# Patient Record
Sex: Male | Born: 1935 | Race: White | Smoking: Never smoker
Health system: Southern US, Community
[De-identification: ages and names within clinical notes are randomized; demographics above are authoritative.]

## PROBLEM LIST (undated history)

## (undated) DIAGNOSIS — H3530 Unspecified macular degeneration: Secondary | ICD-10-CM

## (undated) DIAGNOSIS — N400 Enlarged prostate without lower urinary tract symptoms: Secondary | ICD-10-CM

## (undated) DIAGNOSIS — M1990 Unspecified osteoarthritis, unspecified site: Secondary | ICD-10-CM

## (undated) DIAGNOSIS — H409 Unspecified glaucoma: Secondary | ICD-10-CM

## (undated) DIAGNOSIS — N289 Disorder of kidney and ureter, unspecified: Secondary | ICD-10-CM

## (undated) DIAGNOSIS — I442 Atrioventricular block, complete: Secondary | ICD-10-CM

## (undated) DIAGNOSIS — Z950 Presence of cardiac pacemaker: Secondary | ICD-10-CM

## (undated) DIAGNOSIS — R296 Repeated falls: Secondary | ICD-10-CM

## (undated) DIAGNOSIS — C801 Malignant (primary) neoplasm, unspecified: Secondary | ICD-10-CM

## (undated) DIAGNOSIS — K274 Chronic or unspecified peptic ulcer, site unspecified, with hemorrhage: Secondary | ICD-10-CM

## (undated) DIAGNOSIS — I10 Essential (primary) hypertension: Secondary | ICD-10-CM

## (undated) DIAGNOSIS — I4891 Unspecified atrial fibrillation: Secondary | ICD-10-CM

## (undated) DIAGNOSIS — E559 Vitamin D deficiency, unspecified: Secondary | ICD-10-CM

## (undated) HISTORY — PX: EXTERNAL EAR SURGERY: SHX627

## (undated) HISTORY — PX: APPENDECTOMY: SHX54

## (undated) HISTORY — PX: BRAIN MENINGIOMA EXCISION: SHX576

## (undated) HISTORY — PX: EYE SURGERY: SHX253

## (undated) HISTORY — PX: PACEMAKER INSERTION: SHX728

## (undated) HISTORY — PX: TONSILLECTOMY: SUR1361

---

## 1994-07-23 DIAGNOSIS — C801 Malignant (primary) neoplasm, unspecified: Secondary | ICD-10-CM

## 1994-07-23 HISTORY — DX: Malignant (primary) neoplasm, unspecified: C80.1

## 2011-01-09 ENCOUNTER — Other Ambulatory Visit: Payer: Self-pay | Admitting: Family Medicine

## 2011-01-10 ENCOUNTER — Other Ambulatory Visit: Payer: Self-pay | Admitting: Family Medicine

## 2011-01-10 DIAGNOSIS — Z9889 Other specified postprocedural states: Secondary | ICD-10-CM

## 2011-01-12 ENCOUNTER — Ambulatory Visit
Admission: RE | Admit: 2011-01-12 | Discharge: 2011-01-12 | Disposition: A | Discharge status: Home / Self Care | Payer: Self-pay | Source: Ambulatory Visit | Attending: Family Medicine | Admitting: Family Medicine

## 2011-01-12 DIAGNOSIS — Z8603 Personal history of neoplasm of uncertain behavior: Secondary | ICD-10-CM

## 2011-01-12 MED ORDER — IOHEXOL 300 MG/ML  SOLN
50.0000 mL | Freq: Once | INTRAMUSCULAR | Status: AC | PRN
  Administered 2011-01-12: 50 mL via INTRAVENOUS

## 2011-05-24 DEATH — deceased

## 2014-11-10 ENCOUNTER — Emergency Department (HOSPITAL_BASED_OUTPATIENT_CLINIC_OR_DEPARTMENT_OTHER)
Admission: EM | Admit: 2014-11-10 | Discharge: 2014-11-10 | Disposition: A | Discharge status: Home / Self Care | Payer: Medicare HMO | Attending: Emergency Medicine | Admitting: Emergency Medicine

## 2014-11-10 ENCOUNTER — Emergency Department (HOSPITAL_BASED_OUTPATIENT_CLINIC_OR_DEPARTMENT_OTHER): Payer: Medicare HMO

## 2014-11-10 ENCOUNTER — Encounter (HOSPITAL_BASED_OUTPATIENT_CLINIC_OR_DEPARTMENT_OTHER): Payer: Self-pay

## 2014-11-10 DIAGNOSIS — Z8639 Personal history of other endocrine, nutritional and metabolic disease: Secondary | ICD-10-CM | POA: Diagnosis not present

## 2014-11-10 DIAGNOSIS — Z8711 Personal history of peptic ulcer disease: Secondary | ICD-10-CM | POA: Insufficient documentation

## 2014-11-10 DIAGNOSIS — Z87438 Personal history of other diseases of male genital organs: Secondary | ICD-10-CM | POA: Insufficient documentation

## 2014-11-10 DIAGNOSIS — M1990 Unspecified osteoarthritis, unspecified site: Secondary | ICD-10-CM | POA: Diagnosis not present

## 2014-11-10 DIAGNOSIS — I10 Essential (primary) hypertension: Secondary | ICD-10-CM | POA: Diagnosis not present

## 2014-11-10 DIAGNOSIS — W01198A Fall on same level from slipping, tripping and stumbling with subsequent striking against other object, initial encounter: Secondary | ICD-10-CM | POA: Insufficient documentation

## 2014-11-10 DIAGNOSIS — H409 Unspecified glaucoma: Secondary | ICD-10-CM | POA: Insufficient documentation

## 2014-11-10 DIAGNOSIS — Y998 Other external cause status: Secondary | ICD-10-CM | POA: Insufficient documentation

## 2014-11-10 DIAGNOSIS — S0991XA Unspecified injury of ear, initial encounter: Secondary | ICD-10-CM | POA: Diagnosis present

## 2014-11-10 DIAGNOSIS — Y9289 Other specified places as the place of occurrence of the external cause: Secondary | ICD-10-CM | POA: Insufficient documentation

## 2014-11-10 DIAGNOSIS — Z950 Presence of cardiac pacemaker: Secondary | ICD-10-CM | POA: Insufficient documentation

## 2014-11-10 DIAGNOSIS — Z7982 Long term (current) use of aspirin: Secondary | ICD-10-CM | POA: Diagnosis not present

## 2014-11-10 DIAGNOSIS — Z79899 Other long term (current) drug therapy: Secondary | ICD-10-CM | POA: Insufficient documentation

## 2014-11-10 DIAGNOSIS — Y9389 Activity, other specified: Secondary | ICD-10-CM | POA: Diagnosis not present

## 2014-11-10 DIAGNOSIS — S00431A Contusion of right ear, initial encounter: Secondary | ICD-10-CM | POA: Insufficient documentation

## 2014-11-10 DIAGNOSIS — Z87448 Personal history of other diseases of urinary system: Secondary | ICD-10-CM | POA: Insufficient documentation

## 2014-11-10 HISTORY — DX: Unspecified glaucoma: H40.9

## 2014-11-10 HISTORY — DX: Presence of cardiac pacemaker: Z95.0

## 2014-11-10 HISTORY — DX: Unspecified atrial fibrillation: I48.91

## 2014-11-10 HISTORY — DX: Unspecified osteoarthritis, unspecified site: M19.90

## 2014-11-10 HISTORY — DX: Benign prostatic hyperplasia without lower urinary tract symptoms: N40.0

## 2014-11-10 HISTORY — DX: Disorder of kidney and ureter, unspecified: N28.9

## 2014-11-10 HISTORY — DX: Chronic or unspecified peptic ulcer, site unspecified, with hemorrhage: K27.4

## 2014-11-10 HISTORY — DX: Vitamin D deficiency, unspecified: E55.9

## 2014-11-10 HISTORY — DX: Essential (primary) hypertension: I10

## 2014-11-10 HISTORY — DX: Unspecified macular degeneration: H35.30

## 2014-11-10 HISTORY — DX: Atrioventricular block, complete: I44.2

## 2014-11-10 NOTE — ED Notes (Signed)
Urinal provided.

## 2014-11-10 NOTE — ED Notes (Signed)
Patient transported to CT 

## 2014-11-10 NOTE — ED Notes (Signed)
MD at bedside. 

## 2014-11-10 NOTE — ED Provider Notes (Signed)
CSN: [id number]     Arrival date & time 11/10/14  1504 History   First MD Initiated Contact with Patient 11/10/14 1518     Chief Complaint  Patient presents with  . Fall     Patient is a 81 y.o. male presenting with fall. The history is provided by the patient and a relative. No language interpreter was used.  Fall   Gerald Fox presents for evaluation of injuries on the fall. He had a fall around 7 AM when he was trying to get into the tub and the rail broke away. He fell back, striking the right ear on the ground. He denies any loss of consciousness. He denies any headache, nausea, vomiting, weakness, chest pain. Symptoms are moderate, constant, worsening.  Past Medical History  Diagnosis Date  . Hypertension   . Third degree AV block   . Pacemaker   . Renal disorder   . Renal insufficiency   . BPH (benign prostatic hyperplasia)   . Glaucoma   . Macular degeneration   . A-fib   . Peptic ulcer disease with hemorrhage   . Vitamin D deficiency   . Arthritis    Past Surgical History  Procedure Laterality Date  . Brain meningioma excision    . Pacemaker insertion    . Appendectomy    . Tonsillectomy    . Eye surgery     No family history on file. History  Substance Use Topics  . Smoking status: Never Smoker   . Smokeless tobacco: Not on file  . Alcohol Use: Yes    Review of Systems  All other systems reviewed and are negative.     Allergies  Review of patient's allergies indicates no known allergies.  Home Medications   Prior to Admission medications   Medication Sig Start Date End Date Taking? Authorizing Provider  amLODipine (NORVASC) 5 MG tablet Take 5 mg by mouth daily.   Yes Historical Provider, MD  aspirin EC 81 MG tablet Take 81 mg by mouth daily.   Yes Historical Provider, MD  dorzolamide (TRUSOPT) 2 % ophthalmic solution 1 drop 3 (three) times daily.   Yes Historical Provider, MD  hydrochlorothiazide (MICROZIDE) 12.5 MG capsule Take 12.5 mg by mouth  daily.   Yes Historical Provider, MD  latanoprost (XALATAN) 0.005 % ophthalmic solution 1 drop at bedtime.   Yes Historical Provider, MD  metoprolol succinate (TOPROL-XL) 100 MG 24 hr tablet Take 100 mg by mouth daily. Take with or immediately following a meal.   Yes Historical Provider, MD   BP 170/67 mmHg  Pulse 54  Temp(Src) 97.9 F (36.6 C) (Oral)  Resp 16  Ht 5\' 5"  (1.651 m)  Wt 164 lb (74.39 kg)  BMI 27.29 kg/m2  SpO2 100% Physical Exam  Constitutional: He is oriented to person, place, and time. He appears well-developed and well-nourished.  HENT:  Head: Normocephalic.  Large amounts of swelling and ecchymosis to the helix and antihelix of the ear. There is a small, less than 1 cm laceration to the posterior ear.  Eyes:  Left pupil 2-3 mm and reactive to light, right pupil 3-4 mm and reactive to light.  Cardiovascular: Normal rate and regular rhythm.   No murmur heard. Pulmonary/Chest: Effort normal and breath sounds normal. No respiratory distress. He exhibits no tenderness.  Abdominal: Soft. There is no tenderness. There is no rebound and no guarding.  Musculoskeletal: He exhibits no tenderness.  1+ pitting edema in BLE. No extremity tenderness to palpation. No  C, T, L-spine tenderness to palpation.  Neurological: He is alert and oriented to person, place, and time.  Skin: Skin is warm and dry.  Psychiatric: He has a normal mood and affect. His behavior is normal.  Nursing note and vitals reviewed.   ED Course  Procedures (including critical care time) Labs Review Labs Reviewed - No data to display  Imaging Review Ct Head Wo Contrast  11/10/2014   CLINICAL DATA:  81 year old male who fell and hit head on toilet. Bleeding from a here. Initial encounter.  EXAM: CT HEAD WITHOUT CONTRAST  CT CERVICAL SPINE WITHOUT CONTRAST  TECHNIQUE: Multidetector CT imaging of the head and cervical spine was performed following the standard protocol without intravenous contrast.  Multiplanar CT image reconstructions of the cervical spine were also generated.  COMPARISON:  Head CT 01/12/2011.  FINDINGS: CT HEAD FINDINGS  Similar mild paranasal sinus mucosal thickening. Bilateral tympanic cavities are clear. Chronic mild inferior left mastoid effusion is stable. Otherwise bilateral mastoids are clear. Bilateral hearing aids.  On the right there is severe soft tissue thickening of the pinna up to 16 mm in thickness. No adjacent scalp soft tissue hematoma identified.  No acute scalp soft tissue injury identified elsewhere. Sequelae of left vertex craniotomy appears stable. Superimposed right vertex burr hole placement. No acute osseous abnormality identified.  Chronic left superior frontal gyrus encephalomalacia. No ventriculomegaly. No midline shift, mass effect, or evidence of intracranial mass lesion. No acute intracranial hemorrhage identified. No evidence of cortically based acute infarction identified. No suspicious intracranial vascular hyperdensity.  CT CERVICAL SPINE FINDINGS  C2-C3 ankylosis. Visualized skull base is intact. No atlanto-occipital dissociation. Odontoid intact. Exaggerated cervical lordosis. Cervicothoracic junction alignment is within normal limits. Bilateral posterior element alignment is within normal limits. Widespread cervical chronic disc, endplate, and facet degeneration. No acute cervical spine fracture identified. Negative lung apices. Negative noncontrast paraspinal soft tissues.  IMPRESSION: 1. Severe soft tissue swelling / hematoma of the right pinna. No underlying fracture identified. 2. No acute traumatic injury to the brain identified. 3. No acute fracture or listhesis identified in the cervical spine. Ligamentous injury is not excluded.   Electronically Signed   By: Genevie Ann M.D.   On: 11/10/2014 16:21   Ct Cervical Spine Wo Contrast  11/10/2014   CLINICAL DATA:  81 year old male who fell and hit head on toilet. Bleeding from a here. Initial encounter.   EXAM: CT HEAD WITHOUT CONTRAST  CT CERVICAL SPINE WITHOUT CONTRAST  TECHNIQUE: Multidetector CT imaging of the head and cervical spine was performed following the standard protocol without intravenous contrast. Multiplanar CT image reconstructions of the cervical spine were also generated.  COMPARISON:  Head CT 01/12/2011.  FINDINGS: CT HEAD FINDINGS  Similar mild paranasal sinus mucosal thickening. Bilateral tympanic cavities are clear. Chronic mild inferior left mastoid effusion is stable. Otherwise bilateral mastoids are clear. Bilateral hearing aids.  On the right there is severe soft tissue thickening of the pinna up to 16 mm in thickness. No adjacent scalp soft tissue hematoma identified.  No acute scalp soft tissue injury identified elsewhere. Sequelae of left vertex craniotomy appears stable. Superimposed right vertex burr hole placement. No acute osseous abnormality identified.  Chronic left superior frontal gyrus encephalomalacia. No ventriculomegaly. No midline shift, mass effect, or evidence of intracranial mass lesion. No acute intracranial hemorrhage identified. No evidence of cortically based acute infarction identified. No suspicious intracranial vascular hyperdensity.  CT CERVICAL SPINE FINDINGS  C2-C3 ankylosis. Visualized skull base is intact. No atlanto-occipital  dissociation. Odontoid intact. Exaggerated cervical lordosis. Cervicothoracic junction alignment is within normal limits. Bilateral posterior element alignment is within normal limits. Widespread cervical chronic disc, endplate, and facet degeneration. No acute cervical spine fracture identified. Negative lung apices. Negative noncontrast paraspinal soft tissues.  IMPRESSION: 1. Severe soft tissue swelling / hematoma of the right pinna. No underlying fracture identified. 2. No acute traumatic injury to the brain identified. 3. No acute fracture or listhesis identified in the cervical spine. Ligamentous injury is not excluded.    Electronically Signed   By: Genevie Ann M.D.   On: 11/10/2014 16:21     EKG Interpretation None      MDM   Final diagnoses:  Ear hematoma, right, initial encounter    Patient here for evaluation of injuries following a mechanical fall. He does have a significant right auricular hematoma. Discussed with Dr. Janace Hoard with ENT, will see in the office tomorrow. Patient has a known history of renal insufficiency and takes baby aspirin daily.  Quintella Reichert, MD 11/10/14 [phone]

## 2014-11-10 NOTE — Discharge Instructions (Signed)
Auricle Injuries You have an injury to your external ear (auricle). The ear has a layer of skin over cartilage. A cut or bruise to the ear can separate the skin from the cartilage underneath. This can cause problems with healing if blood gathers between the skin and the cartilage. Permanent damage to the ear may result if the excess blood is not drained within 1 to 2 days. Stitches, tape, or tissue glue may be used to close a cut. A pressure bandage may be used to keep blood from forming under the injured skin. If there is a lot of blood present (hematoma), a needle aspiration may be needed to remove it. You must have the ear checked within 1 to 2 days or as directed if you have had this type of injury. This is see if the blood has accumulated again. Call your caregiver for a follow-up exam as recommended.  SEEK IMMEDIATE MEDICAL CARE IF:  You develop severe pain.  You develop a fever or pus like drainage.  You have increased hearing loss or other problems. MAKE SURE YOU:   Understand these instructions.  Will watch your condition.  Will get help right away if you are not doing well or get worse. Document Released: 07/09/2005 Document Revised: 10/01/2011 Document Reviewed: 12/26/2006 [facility] Patient Information 2015 [place], [place]. This information is not intended to replace advice given to you by your health care provider. Make sure you discuss any questions you have with your health care provider.

## 2014-11-10 NOTE — ED Notes (Signed)
Pt reports trying to get in tub.  Reports grabbed bar and it fell off and he fell struck right ear on commode.  Was seen at PCP but wanted him to be checked ED.  Pt is on ASA.  Denies LOC

## 2014-12-20 ENCOUNTER — Encounter (HOSPITAL_BASED_OUTPATIENT_CLINIC_OR_DEPARTMENT_OTHER): Payer: Self-pay

## 2014-12-20 ENCOUNTER — Emergency Department (HOSPITAL_BASED_OUTPATIENT_CLINIC_OR_DEPARTMENT_OTHER)
Admission: EM | Admit: 2014-12-20 | Discharge: 2014-12-20 | Disposition: A | Discharge status: Home / Self Care | Payer: Medicare HMO | Attending: Emergency Medicine | Admitting: Emergency Medicine

## 2014-12-20 ENCOUNTER — Emergency Department (HOSPITAL_BASED_OUTPATIENT_CLINIC_OR_DEPARTMENT_OTHER): Payer: Medicare HMO

## 2014-12-20 DIAGNOSIS — Z87448 Personal history of other diseases of urinary system: Secondary | ICD-10-CM | POA: Insufficient documentation

## 2014-12-20 DIAGNOSIS — Z8639 Personal history of other endocrine, nutritional and metabolic disease: Secondary | ICD-10-CM | POA: Diagnosis not present

## 2014-12-20 DIAGNOSIS — Z950 Presence of cardiac pacemaker: Secondary | ICD-10-CM | POA: Diagnosis not present

## 2014-12-20 DIAGNOSIS — I10 Essential (primary) hypertension: Secondary | ICD-10-CM | POA: Diagnosis not present

## 2014-12-20 DIAGNOSIS — Z79899 Other long term (current) drug therapy: Secondary | ICD-10-CM | POA: Diagnosis not present

## 2014-12-20 DIAGNOSIS — M25531 Pain in right wrist: Secondary | ICD-10-CM | POA: Diagnosis present

## 2014-12-20 DIAGNOSIS — M1990 Unspecified osteoarthritis, unspecified site: Secondary | ICD-10-CM | POA: Insufficient documentation

## 2014-12-20 DIAGNOSIS — Z7982 Long term (current) use of aspirin: Secondary | ICD-10-CM | POA: Insufficient documentation

## 2014-12-20 DIAGNOSIS — M109 Gout, unspecified: Secondary | ICD-10-CM | POA: Diagnosis not present

## 2014-12-20 DIAGNOSIS — Z8719 Personal history of other diseases of the digestive system: Secondary | ICD-10-CM | POA: Insufficient documentation

## 2014-12-20 DIAGNOSIS — H409 Unspecified glaucoma: Secondary | ICD-10-CM | POA: Insufficient documentation

## 2014-12-20 LAB — BASIC METABOLIC PANEL
ANION GAP: 10 (ref 5–15)
BUN: 31 mg/dL — AB (ref 6–20)
CHLORIDE: 103 mmol/L (ref 101–111)
CO2: 32 mmol/L (ref 22–32)
Calcium: 8.7 mg/dL — ABNORMAL LOW (ref 8.9–10.3)
Creatinine, Ser: 1.97 mg/dL — ABNORMAL HIGH (ref 0.61–1.24)
GFR calc Af Amer: 31 mL/min — ABNORMAL LOW (ref >60)
GFR calc non Af Amer: 27 mL/min — ABNORMAL LOW (ref >60)
Glucose, Bld: 121 mg/dL — ABNORMAL HIGH (ref 65–99)
POTASSIUM: 3.7 mmol/L (ref 3.5–5.1)
Sodium: 145 mmol/L (ref 135–145)

## 2014-12-20 LAB — CBC WITH DIFFERENTIAL/PLATELET
BASOS PCT: 0 % (ref 0–1)
Basophils Absolute: 0 10*3/uL (ref 0.0–0.1)
EOS ABS: 0.1 10*3/uL (ref 0.0–0.7)
Eosinophils Relative: 1 % (ref 0–5)
HEMATOCRIT: 38.4 % — AB (ref 39.0–52.0)
Hemoglobin: 12.4 g/dL — ABNORMAL LOW (ref 13.0–17.0)
Lymphocytes Relative: 20 % (ref 12–46)
Lymphs Abs: 1.9 10*3/uL (ref 0.7–4.0)
MCH: 31.9 pg (ref 26.0–34.0)
MCHC: 32.3 g/dL (ref 30.0–36.0)
MCV: 98.7 fL (ref 78.0–100.0)
Monocytes Absolute: 1.3 10*3/uL — ABNORMAL HIGH (ref 0.1–1.0)
Monocytes Relative: 14 % — ABNORMAL HIGH (ref 3–12)
NEUTROS ABS: 6 10*3/uL (ref 1.7–7.7)
NEUTROS PCT: 65 % (ref 43–77)
Platelets: 232 10*3/uL (ref 150–400)
RBC: 3.89 MIL/uL — AB (ref 4.22–5.81)
RDW: 13.4 % (ref 11.5–15.5)
WBC: 9.3 10*3/uL (ref 4.0–10.5)

## 2014-12-20 LAB — URIC ACID: URIC ACID, SERUM: 7.7 mg/dL — AB (ref 4.4–7.6)

## 2014-12-20 LAB — SEDIMENTATION RATE: SED RATE: 55 mm/h — AB (ref 0–16)

## 2014-12-20 MED ORDER — PREDNISONE 10 MG PO TABS: 20.0000 mg | ORAL_TABLET | Freq: Every day | ORAL | Status: DC

## 2014-12-20 NOTE — ED Notes (Addendum)
Redness, swelling to right wrist-first noticed yesterday-denies injury-daughter with pt-she states now has spread to hand

## 2014-12-20 NOTE — Discharge Instructions (Signed)
Suspected Gout Gout is an inflammatory arthritis caused by a buildup of uric acid crystals in the joints. Uric acid is a chemical that is normally present in the blood. When the level of uric acid in the blood is too high it can form crystals that deposit in your joints and tissues. This causes joint redness, soreness, and swelling (inflammation). Repeat attacks are common. Over time, uric acid crystals can form into masses (tophi) near a joint, destroying bone and causing disfigurement. Gout is treatable and often preventable. CAUSES  The disease begins with elevated levels of uric acid in the blood. Uric acid is produced by your body when it breaks down a naturally found substance called purines. Certain foods you eat, such as meats and fish, contain high amounts of purines. Causes of an elevated uric acid level include:  Being passed down from parent to child (heredity).  Diseases that cause increased uric acid production (such as obesity, psoriasis, and certain cancers).  Excessive alcohol use.  Diet, especially diets rich in meat and seafood.  Medicines, including certain cancer-fighting medicines (chemotherapy), water pills (diuretics), and aspirin.  Chronic kidney disease. The kidneys are no longer able to remove uric acid well.  Problems with metabolism. Conditions strongly associated with gout include:  Obesity.  High blood pressure.  High cholesterol.  Diabetes. Not everyone with elevated uric acid levels gets gout. It is not understood why some people get gout and others do not. Surgery, joint injury, and eating too much of certain foods are some of the factors that can lead to gout attacks. SYMPTOMS   An attack of gout comes on quickly. It causes intense pain with redness, swelling, and warmth in a joint.  Fever can occur.  Often, only one joint is involved. Certain joints are more commonly involved:  Base of the big  toe.  Knee.  Ankle.  Wrist.  Finger. Without treatment, an attack usually goes away in a few days to weeks. Between attacks, you usually will not have symptoms, which is different from many other forms of arthritis. DIAGNOSIS  Your caregiver will suspect gout based on your symptoms and exam. In some cases, tests may be recommended. The tests may include:  Blood tests.  Urine tests.  X-rays.  Joint fluid exam. This exam requires a needle to remove fluid from the joint (arthrocentesis). Using a microscope, gout is confirmed when uric acid crystals are seen in the joint fluid. TREATMENT  There are two phases to gout treatment: treating the sudden onset (acute) attack and preventing attacks (prophylaxis).  Treatment of an Acute Attack.  Medicines are used. These include anti-inflammatory medicines or steroid medicines.  An injection of steroid medicine into the affected joint is sometimes necessary.  The painful joint is rested. Movement can worsen the arthritis.  You may use warm or cold treatments on painful joints, depending which works best for you.  Treatment to Prevent Attacks.  If you suffer from frequent gout attacks, your caregiver may advise preventive medicine. These medicines are started after the acute attack subsides. These medicines either help your kidneys eliminate uric acid from your body or decrease your uric acid production. You may need to stay on these medicines for a very long time.  The early phase of treatment with preventive medicine can be associated with an increase in acute gout attacks. For this reason, during the first few months of treatment, your caregiver may also advise you to take medicines usually used for acute gout treatment. Be sure  you understand your caregiver's directions. Your caregiver may make several adjustments to your medicine dose before these medicines are effective.  Discuss dietary treatment with your caregiver or dietitian.  Alcohol and drinks high in sugar and fructose and foods such as meat, poultry, and seafood can increase uric acid levels. Your caregiver or dietitian can advise you on drinks and foods that should be limited. HOME CARE INSTRUCTIONS   Do not take aspirin to relieve pain. This raises uric acid levels.  Only take over-the-counter or prescription medicines for pain, discomfort, or fever as directed by your caregiver.  Rest the joint as much as possible. When in bed, keep sheets and blankets off painful areas.  Keep the affected joint raised (elevated).  Apply warm or cold treatments to painful joints. Use of warm or cold treatments depends on which works best for you.  Use crutches if the painful joint is in your leg.  Drink enough fluids to keep your urine clear or pale yellow. This helps your body get rid of uric acid. Limit alcohol, sugary drinks, and fructose drinks.  Follow your dietary instructions. Pay careful attention to the amount of protein you eat. Your daily diet should emphasize fruits, vegetables, whole grains, and fat-free or low-fat milk products. Discuss the use of coffee, vitamin C, and cherries with your caregiver or dietitian. These may be helpful in lowering uric acid levels.  Maintain a healthy body weight. SEEK MEDICAL CARE IF:   You develop diarrhea, vomiting, or any side effects from medicines.  You do not feel better in 24 hours, or you are getting worse. SEEK IMMEDIATE MEDICAL CARE IF:   Your joint becomes suddenly more tender, and you have chills or a fever. MAKE SURE YOU:   Understand these instructions.  Will watch your condition.  Will get help right away if you are not doing well or get worse. Document Released: 07/06/2000 Document Revised: 11/23/2013 Document Reviewed: 02/20/2012 [facility] Patient Information 2015 [place], [place]. This information is not intended to replace advice given to you by your health care provider. Make sure you discuss any  questions you have with your health care provider.

## 2014-12-20 NOTE — ED Provider Notes (Signed)
CSN: [id number]     Arrival date & time 12/20/14  1311 History   First MD Initiated Contact with Patient 12/20/14 1329     Chief Complaint  Patient presents with  . Wrist Pain     (Consider location/radiation/quality/duration/timing/severity/associated sxs/prior Treatment) HPI Patient's right wrist had developed mild swelling first noted yesterday. There is no known injury. Today the patient had increased swelling of the ulnar aspect of the wrist and as well the fifth digit. There has been a significant amount of redness that has developed. The patient reports that is very tender to the touch and to movement of the wrist. The fifth digit is less tender but more red and swollen. There has been no fever, chills or general malaise. The patient has significant osteoarthritis with arthritic deformities but has not had problems with erythema or swelling. The patient has otherwise been well and functioning at baseline level of activity. Past Medical History  Diagnosis Date  . Hypertension   . Third degree AV block   . Pacemaker   . Renal disorder   . Renal insufficiency   . BPH (benign prostatic hyperplasia)   . Glaucoma   . Macular degeneration   . A-fib   . Peptic ulcer disease with hemorrhage   . Vitamin D deficiency   . Arthritis    Past Surgical History  Procedure Laterality Date  . Brain meningioma excision    . Pacemaker insertion    . Appendectomy    . Tonsillectomy    . Eye surgery    . External ear surgery     No family history on file. History  Substance Use Topics  . Smoking status: Never Smoker   . Smokeless tobacco: Not on file  . Alcohol Use: Yes    Review of Systems Constitutional: No fevers no chills no general malaise. Respiratory: No chest pain dyspnea or cough   Allergies  Review of patient's allergies indicates no known allergies.  Home Medications   Prior to Admission medications   Medication Sig Start Date End Date Taking? Authorizing Provider    amLODipine (NORVASC) 5 MG tablet Take 5 mg by mouth daily.    Historical Provider, MD  aspirin EC 81 MG tablet Take 81 mg by mouth daily.    Historical Provider, MD  dorzolamide (TRUSOPT) 2 % ophthalmic solution 1 drop 3 (three) times daily.    Historical Provider, MD  hydrochlorothiazide (MICROZIDE) 12.5 MG capsule Take 12.5 mg by mouth daily.    Historical Provider, MD  latanoprost (XALATAN) 0.005 % ophthalmic solution 1 drop at bedtime.    Historical Provider, MD  metoprolol succinate (TOPROL-XL) 100 MG 24 hr tablet Take 100 mg by mouth daily. Take with or immediately following a meal.    Historical Provider, MD  predniSONE (DELTASONE) 10 MG tablet Take 2 tablets (20 mg total) by mouth daily. 12/20/14   Charlesetta Shanks, MD   BP 151/64 mmHg  Pulse 51  Temp(Src) 98.5 F (36.9 C) (Oral)  Resp 18  Ht 5\' 6"  (1.676 m)  Wt 165 lb (74.844 kg)  BMI 26.64 kg/m2  SpO2 95% Physical Exam  Constitutional: He is oriented to person, place, and time. He appears well-developed and well-nourished. No distress.  Eyes: EOM are normal.  Pulmonary/Chest: Effort normal.  Musculoskeletal:  Right ulnar aspect of the wrist has edema and erythema. This extends up to the fifth digit which has significant arthritic changes but also erythema and moderate swelling relative to the left. Radial pulses are  normal. See the images  Neurological: He is alert and oriented to person, place, and time.  Skin: Skin is warm and dry.  Psychiatric: He has a normal mood and affect.          ED Course  Procedures (including critical care time) Labs Review Labs Reviewed  BASIC METABOLIC PANEL - Abnormal; Notable for the following:    Glucose, Bld 121 (*)    BUN 31 (*)    Creatinine, Ser 1.97 (*)    Calcium 8.7 (*)    GFR calc non Af Amer 27 (*)    GFR calc Af Amer 31 (*)    All other components within normal limits  CBC WITH DIFFERENTIAL/PLATELET - Abnormal; Notable for the following:    RBC 3.89 (*)    Hemoglobin  12.4 (*)    HCT 38.4 (*)    Monocytes Relative 14 (*)    Monocytes Absolute 1.3 (*)    All other components within normal limits  URIC ACID - Abnormal; Notable for the following:    Uric Acid, Serum 7.7 (*)    All other components within normal limits  SEDIMENTATION RATE - Abnormal; Notable for the following:    Sed Rate 55 (*)    All other components within normal limits    Imaging Review Dg Wrist Complete Right  12/20/2014   CLINICAL DATA:  Ulnar pain, no known injury, initial encounter  EXAM: RIGHT WRIST - COMPLETE 3+ VIEW  COMPARISON:  None.  FINDINGS: Diffuse osteopenia is noted. Radiocarpal degenerative changes are noted with subchondral sclerosis and joint space narrowing. Some widening of the scapholunate space is noted likely related chronic ligamentous injury. Well corticated bony densities noted adjacent to the distal ulna consistent with prior ulnar styloid fracture with nonunion. Generalized soft tissue swelling is seen. Vascular calcifications are noted.  IMPRESSION: Chronic changes without acute bony abnormality.   Electronically Signed   By: Inez Catalina M.D.   On: 12/20/2014 14:10   Dg Hand Complete Right  12/20/2014   CLINICAL DATA:  Ulnar wrist pain, no known injury, initial encounter  EXAM: RIGHT HAND - COMPLETE 3+ VIEW  COMPARISON:  None.  FINDINGS: Diffuse osteopenia is noted. Degenerative changes in the wrist joint are again seen similar to that noted on prior wrist films. Mild interphalangeal degenerative changes are noted. There is distal walls of the second and third distal phalanges. This is likely related to prior trauma. No acute abnormality seen.  IMPRESSION: Degenerative change without acute bony abnormality.   Electronically Signed   By: Inez Catalina M.D.   On: 12/20/2014 14:11     EKG Interpretation None      MDM   Final diagnoses:  Acute gout of right hand, unspecified cause   A she is nontoxic without associated symptoms of fever or general illness.  No leukocytosis. Uric acid is mildly elevated. At this time patient will be treated for suspected gout flare. He has good home support with his daughter and will try a low dose of prednisone and Tylenol for pain. They wish to avoid NSAIDs and narcotics.    Charlesetta Shanks, MD 12/20/14 1700

## 2015-03-09 ENCOUNTER — Emergency Department (HOSPITAL_BASED_OUTPATIENT_CLINIC_OR_DEPARTMENT_OTHER)
Admission: EM | Admit: 2015-03-09 | Discharge: 2015-03-09 | Disposition: A | Discharge status: Home / Self Care | Payer: Medicare HMO | Attending: Emergency Medicine | Admitting: Emergency Medicine

## 2015-03-09 ENCOUNTER — Emergency Department (HOSPITAL_BASED_OUTPATIENT_CLINIC_OR_DEPARTMENT_OTHER): Payer: Medicare HMO

## 2015-03-09 ENCOUNTER — Encounter (HOSPITAL_BASED_OUTPATIENT_CLINIC_OR_DEPARTMENT_OTHER): Payer: Self-pay | Admitting: *Deleted

## 2015-03-09 DIAGNOSIS — W1839XA Other fall on same level, initial encounter: Secondary | ICD-10-CM | POA: Insufficient documentation

## 2015-03-09 DIAGNOSIS — Z8669 Personal history of other diseases of the nervous system and sense organs: Secondary | ICD-10-CM | POA: Insufficient documentation

## 2015-03-09 DIAGNOSIS — Z79899 Other long term (current) drug therapy: Secondary | ICD-10-CM | POA: Diagnosis not present

## 2015-03-09 DIAGNOSIS — Z8639 Personal history of other endocrine, nutritional and metabolic disease: Secondary | ICD-10-CM | POA: Insufficient documentation

## 2015-03-09 DIAGNOSIS — S3992XA Unspecified injury of lower back, initial encounter: Secondary | ICD-10-CM | POA: Diagnosis present

## 2015-03-09 DIAGNOSIS — I4891 Unspecified atrial fibrillation: Secondary | ICD-10-CM | POA: Diagnosis not present

## 2015-03-09 DIAGNOSIS — Y92009 Unspecified place in unspecified non-institutional (private) residence as the place of occurrence of the external cause: Secondary | ICD-10-CM | POA: Diagnosis not present

## 2015-03-09 DIAGNOSIS — N289 Disorder of kidney and ureter, unspecified: Secondary | ICD-10-CM | POA: Diagnosis not present

## 2015-03-09 DIAGNOSIS — R319 Hematuria, unspecified: Secondary | ICD-10-CM

## 2015-03-09 DIAGNOSIS — M545 Low back pain, unspecified: Secondary | ICD-10-CM

## 2015-03-09 DIAGNOSIS — Z7982 Long term (current) use of aspirin: Secondary | ICD-10-CM | POA: Insufficient documentation

## 2015-03-09 DIAGNOSIS — Y9389 Activity, other specified: Secondary | ICD-10-CM | POA: Insufficient documentation

## 2015-03-09 DIAGNOSIS — S7011XA Contusion of right thigh, initial encounter: Secondary | ICD-10-CM | POA: Diagnosis not present

## 2015-03-09 DIAGNOSIS — Z85841 Personal history of malignant neoplasm of brain: Secondary | ICD-10-CM | POA: Insufficient documentation

## 2015-03-09 DIAGNOSIS — I498 Other specified cardiac arrhythmias: Secondary | ICD-10-CM | POA: Insufficient documentation

## 2015-03-09 DIAGNOSIS — I10 Essential (primary) hypertension: Secondary | ICD-10-CM | POA: Insufficient documentation

## 2015-03-09 DIAGNOSIS — Z7952 Long term (current) use of systemic steroids: Secondary | ICD-10-CM | POA: Insufficient documentation

## 2015-03-09 DIAGNOSIS — M1990 Unspecified osteoarthritis, unspecified site: Secondary | ICD-10-CM | POA: Diagnosis not present

## 2015-03-09 DIAGNOSIS — Z87438 Personal history of other diseases of male genital organs: Secondary | ICD-10-CM | POA: Diagnosis not present

## 2015-03-09 DIAGNOSIS — Z8719 Personal history of other diseases of the digestive system: Secondary | ICD-10-CM | POA: Insufficient documentation

## 2015-03-09 DIAGNOSIS — W19XXXA Unspecified fall, initial encounter: Secondary | ICD-10-CM

## 2015-03-09 DIAGNOSIS — M7989 Other specified soft tissue disorders: Secondary | ICD-10-CM

## 2015-03-09 DIAGNOSIS — R531 Weakness: Secondary | ICD-10-CM

## 2015-03-09 DIAGNOSIS — R609 Edema, unspecified: Secondary | ICD-10-CM

## 2015-03-09 DIAGNOSIS — Y998 Other external cause status: Secondary | ICD-10-CM | POA: Diagnosis not present

## 2015-03-09 HISTORY — DX: Repeated falls: R29.6

## 2015-03-09 HISTORY — DX: Malignant (primary) neoplasm, unspecified: C80.1

## 2015-03-09 LAB — CBC WITH DIFFERENTIAL/PLATELET
BASOS ABS: 0 10*3/uL (ref 0.0–0.1)
BASOS PCT: 0 % (ref 0–1)
Eosinophils Absolute: 0.1 10*3/uL (ref 0.0–0.7)
Eosinophils Relative: 2 % (ref 0–5)
HEMATOCRIT: 33.1 % — AB (ref 39.0–52.0)
HEMOGLOBIN: 10.5 g/dL — AB (ref 13.0–17.0)
Lymphocytes Relative: 33 % (ref 12–46)
Lymphs Abs: 1.8 10*3/uL (ref 0.7–4.0)
MCH: 31.8 pg (ref 26.0–34.0)
MCHC: 31.7 g/dL (ref 30.0–36.0)
MCV: 100.3 fL — ABNORMAL HIGH (ref 78.0–100.0)
Monocytes Absolute: 0.9 10*3/uL (ref 0.1–1.0)
Monocytes Relative: 16 % — ABNORMAL HIGH (ref 3–12)
NEUTROS ABS: 2.8 10*3/uL (ref 1.7–7.7)
Neutrophils Relative %: 49 % (ref 43–77)
Platelets: 258 10*3/uL (ref 150–400)
RBC: 3.3 MIL/uL — ABNORMAL LOW (ref 4.22–5.81)
RDW: 14 % (ref 11.5–15.5)
WBC: 5.7 10*3/uL (ref 4.0–10.5)

## 2015-03-09 LAB — URINE MICROSCOPIC-ADD ON

## 2015-03-09 LAB — CK: Total CK: 78 U/L (ref 49–397)

## 2015-03-09 LAB — HEPATIC FUNCTION PANEL
ALBUMIN: 3.2 g/dL — AB (ref 3.5–5.0)
ALK PHOS: 49 U/L (ref 38–126)
ALT: 11 U/L — ABNORMAL LOW (ref 17–63)
AST: 18 U/L (ref 15–41)
BILIRUBIN TOTAL: 1 mg/dL (ref 0.3–1.2)
Bilirubin, Direct: 0.2 mg/dL (ref 0.1–0.5)
Indirect Bilirubin: 0.8 mg/dL (ref 0.3–0.9)
Total Protein: 5.9 g/dL — ABNORMAL LOW (ref 6.5–8.1)

## 2015-03-09 LAB — BASIC METABOLIC PANEL
ANION GAP: 8 (ref 5–15)
BUN: 35 mg/dL — ABNORMAL HIGH (ref 6–20)
CHLORIDE: 107 mmol/L (ref 101–111)
CO2: 28 mmol/L (ref 22–32)
Calcium: 8.4 mg/dL — ABNORMAL LOW (ref 8.9–10.3)
Creatinine, Ser: 1.76 mg/dL — ABNORMAL HIGH (ref 0.61–1.24)
GFR calc non Af Amer: 30 mL/min — ABNORMAL LOW (ref >60)
GFR, EST AFRICAN AMERICAN: 35 mL/min — AB (ref >60)
Glucose, Bld: 119 mg/dL — ABNORMAL HIGH (ref 65–99)
Potassium: 4 mmol/L (ref 3.5–5.1)
Sodium: 143 mmol/L (ref 135–145)

## 2015-03-09 LAB — URINALYSIS, ROUTINE W REFLEX MICROSCOPIC
Bilirubin Urine: NEGATIVE
GLUCOSE, UA: NEGATIVE mg/dL
Ketones, ur: NEGATIVE mg/dL
Leukocytes, UA: NEGATIVE
Nitrite: NEGATIVE
Protein, ur: 30 mg/dL — AB
Specific Gravity, Urine: 1.013 (ref 1.005–1.030)
Urobilinogen, UA: 1 mg/dL (ref 0.0–1.0)
pH: 7.5 (ref 5.0–8.0)

## 2015-03-09 LAB — OCCULT BLOOD X 1 CARD TO LAB, STOOL: FECAL OCCULT BLD: NEGATIVE

## 2015-03-09 LAB — TROPONIN I: Troponin I: 0.03 ng/mL (ref <0.031)

## 2015-03-09 MED ORDER — SODIUM CHLORIDE 0.9 % IV BOLUS (SEPSIS)
500.0000 mL | Freq: Once | INTRAVENOUS | Status: AC
  Administered 2015-03-09: 500 mL via INTRAVENOUS

## 2015-03-09 NOTE — ED Notes (Signed)
Bladder scanned patient kept getting a reading of >252ml patient advised me that he needed to urinate. Patient's daughter held urinal in place, patient was able to urinate. Scott RN notified.

## 2015-03-09 NOTE — ED Notes (Signed)
Daughter of patient states the patient lives independently at [place].  States he has a three or four month history of increased falls.  One week ago the patient had a fall and has a large bruise on the posterior right leg.  This morning while getting out of bed, he was bending over to unplug his overnight urine bag, he fell forward and landing on the floor and unable to get up for two hours.

## 2015-03-09 NOTE — ED Notes (Signed)
PA at bedside.

## 2015-03-09 NOTE — ED Notes (Signed)
Jeneen Rinks MD at bedside.

## 2015-03-09 NOTE — ED Provider Notes (Signed)
CSN: [id number]     Arrival date & time 03/09/15  1331 History   First MD Initiated Contact with Patient 03/09/15 1341     Chief Complaint  Patient presents with  . Fall     (Consider location/radiation/quality/duration/timing/severity/associated sxs/prior Treatment) The history is provided by the patient and a relative. No language interpreter was used.  Gerald Fox is a 81 y.o male with a history of HTN, renal insufficiency, a-fib, PUD, pacemaker who presents with his daughter for a fall this morning. She states that he struggled to get up for several hours before being found by his housekeeper. She is concerned since he has been falling more frequently in the past several months. He was evaluated by his primary care physician and she states his hemoglobin has dropped significantly in the past 6 months from 14-10. She is also worried because he has several hematomas on his right leg and back from previous falls. He denies any pain at this time. He does admit that the right leg feels heavy. He is ambulatory with a walker at home. He lives independently a [place]. He is on 81 mg of aspirin daily. His daughter states she recently d/c his HCTZ due to soft BP's. He denies any chest pain, shortness of breath, abdominal pain, nausea, vomiting, rectal bleeding. He is hard of hearing at baseline but his mental status is appropriate and he is able to answer questions.  Past Medical History  Diagnosis Date  . Hypertension   . Third degree AV block   . Pacemaker   . Renal disorder   . Renal insufficiency   . BPH (benign prostatic hyperplasia)   . Glaucoma   . Macular degeneration   . A-fib   . Peptic ulcer disease with hemorrhage   . Vitamin D deficiency   . Arthritis   . Falls frequently   . Cancer 1996    brain tumor   Past Surgical History  Procedure Laterality Date  . Brain meningioma excision    . Pacemaker insertion    . Appendectomy    . Tonsillectomy    . Eye surgery    .  External ear surgery     No family history on file. Social History  Substance Use Topics  . Smoking status: Never Smoker   . Smokeless tobacco: None  . Alcohol Use: Yes    Review of Systems  Respiratory: Negative for shortness of breath.   Cardiovascular: Positive for leg swelling. Negative for chest pain.  Gastrointestinal: Negative for blood in stool and anal bleeding.  Neurological: Positive for weakness. Negative for dizziness, syncope and light-headedness.  All other systems reviewed and are negative.     Allergies  Review of patient's allergies indicates no known allergies.  Home Medications   Prior to Admission medications   Medication Sig Start Date End Date Taking? Authorizing Provider  hydrochlorothiazide (MICROZIDE) 12.5 MG capsule Take 12.5 mg by mouth daily.   Yes Historical Provider, MD  amLODipine (NORVASC) 5 MG tablet Take 5 mg by mouth daily.    Historical Provider, MD  aspirin EC 81 MG tablet Take 81 mg by mouth daily.    Historical Provider, MD  dorzolamide (TRUSOPT) 2 % ophthalmic solution 1 drop 3 (three) times daily.    Historical Provider, MD  latanoprost (XALATAN) 0.005 % ophthalmic solution 1 drop at bedtime.    Historical Provider, MD  metoprolol succinate (TOPROL-XL) 100 MG 24 hr tablet Take 100 mg by mouth daily. Take with or  immediately following a meal.    Historical Provider, MD  predniSONE (DELTASONE) 10 MG tablet Take 2 tablets (20 mg total) by mouth daily. 12/20/14   Charlesetta Shanks, MD   BP 173/65 mmHg  Pulse 54  Temp(Src) 97.4 F (36.3 C) (Oral)  Resp 16  Ht 5\' 6"  (1.676 m)  Wt 160 lb (72.576 kg)  BMI 25.84 kg/m2  SpO2 96% Physical Exam  Constitutional: He is oriented to person, place, and time. He appears well-developed and well-nourished.  HENT:  Head: Normocephalic and atraumatic.  Eyes: Conjunctivae are normal.  Cardiovascular: Normal rate.   Pulmonary/Chest: Effort normal. No respiratory distress. He has no wheezes. He has no  rales. He exhibits no tenderness.  Abdominal: Soft. He exhibits no distension. There is no tenderness. There is no rebound and no guarding.  Genitourinary: Circumcised.  Condom cath in place. There is bright red blood mixed with urine in the bag.  Musculoskeletal: Normal range of motion. He exhibits edema. He exhibits no tenderness.  No calf tenderness. Right leg is slightly enlarged compared to left leg. Large hematoma on the posterior right thigh. Able to flex and extend bilateral upper extremities. He is able to bend his knees. Hips and pelvis stable.  Neurological: He is alert and oriented to person, place, and time.  Skin: Skin is warm and dry.  Psychiatric: He has a normal mood and affect. His behavior is normal.    ED Course  Procedures (including critical care time) Labs Review Labs Reviewed  CBC WITH DIFFERENTIAL/PLATELET - Abnormal; Notable for the following:    RBC 3.30 (*)    Hemoglobin 10.5 (*)    HCT 33.1 (*)    MCV 100.3 (*)    Monocytes Relative 16 (*)    All other components within normal limits  BASIC METABOLIC PANEL - Abnormal; Notable for the following:    Glucose, Bld 119 (*)    BUN 35 (*)    Creatinine, Ser 1.76 (*)    Calcium 8.4 (*)    GFR calc non Af Amer 30 (*)    GFR calc Af Amer 35 (*)    All other components within normal limits  HEPATIC FUNCTION PANEL - Abnormal; Notable for the following:    Total Protein 5.9 (*)    Albumin 3.2 (*)    ALT 11 (*)    All other components within normal limits  URINALYSIS, ROUTINE W REFLEX MICROSCOPIC (NOT AT [facility]) - Abnormal; Notable for the following:    Hgb urine dipstick LARGE (*)    Protein, ur 30 (*)    All other components within normal limits  CK  TROPONIN I  OCCULT BLOOD X 1 CARD TO LAB, STOOL  URINE MICROSCOPIC-ADD ON    Imaging Review Dg Chest 2 View  03/09/2015   CLINICAL DATA:  81 year old male who fell this morning, found down. Contusion. Initial encounter.  EXAM: CHEST  2 VIEW  COMPARISON:   [facility] chest x-ray 07/01/2007  FINDINGS: Left chest dual lead cardiac pacemaker. Tortuous thoracic aorta. There is cardiomegaly. Other mediastinal contours are within normal limits. No pneumothorax or pulmonary edema. Eventration of the posterior diaphragm. No definite pleural effusion or confluent pulmonary opacity. No acute osseous abnormality identified.  IMPRESSION: 1. No definite acute cardiopulmonary abnormality. Suspect eventration of the posterior diaphragm responsible for increased lower lobe opacity on the lateral view. 2. Cardiomegaly and tortuous thoracic aorta.   Electronically Signed   By: Genevie Ann M.D.   On: 03/09/2015  14:45   Dg Lumbar Spine Complete  03/09/2015   CLINICAL DATA:  81 year old male status post fall with lumbar back pain. Initial encounter.  EXAM: LUMBAR SPINE - COMPLETE 4+ VIEW  COMPARISON:  None.  FINDINGS: Cardiac pacemaker leads partially visible. Normal lumbar segmentation. Bulky flowing osteophytes in the visible thoracic and lumbar spine. Preserved lumbar vertebral height and alignment except for suggestion of they mild L2 superior endplate deformity (arrow). No other No acute osseous abnormality identified. Visible sacrum appears intact. Severe L3-L4 disc space loss with vacuum disc, endplate sclerosis, and spurring. Calcified aortic atherosclerosis.  IMPRESSION: 1. Suggestion of mild L2 superior endplate compression fracture. If specific therapy such as vertebroplasty is desired, nuclear medicine whole-body bone scan (In light of cardiac pacemaker) would best evaluate further. 2. No other acute osseous abnormality in the lumbar spine. 3. Calcified aortic atherosclerosis.   Electronically Signed   By: Genevie Ann M.D.   On: 03/09/2015 15:36   US Venous Img Lower Unilateral Right  03/09/2015   CLINICAL DATA:  Multiple falls x4 months, bruising, edema, color changes, spider veins.  EXAM: RIGHT LOWER EXTREMITY VENOUS DOPPLER ULTRASOUND  TECHNIQUE: Gray-scale  sonography with compression, as well as color and duplex ultrasound, were performed to evaluate the deep venous system from the level of the common femoral vein through the popliteal and proximal calf veins.  COMPARISON:  None  FINDINGS: Normal compressibility of the common femoral, superficial femoral, and popliteal veins, as well as the proximal calf veins. No filling defects to suggest DVT on grayscale or color Doppler imaging. Doppler waveforms show normal direction of venous flow, normal respiratory phasicity and response to augmentation. Visualized segments of the saphenous venous system normal in caliber and compressibility. Subcutaneous edema in the posterior right thigh in the region of visible bruising. Survey views of the contralateral common femoral vein are unremarkable.  IMPRESSION: 1. No evidence of lower extremity deep vein thrombosis, RIGHT.   Electronically Signed   By: Lucrezia Europe M.D.   On: 03/09/2015 15:38   I have personally reviewed and evaluated these images and lab results as part of my medical decision-making.  EKG interpretation 03/09/15 14:58 Vent. rate 71 BPM PR interval * ms QRS duration 174 ms QT/QTc 460/499 ms P-R-T axes * -66 95 Ventricular paced rhythm  I personally reviewed and agree with the interpretation of this EKG, Ottie Glazier, PA-C. MDM   Final diagnoses:  Leg swelling  Bilateral low back pain without sciatica  Fall, initial encounter  Renal insufficiency  Hematuria  Weakness  Patient is well appearing and in no acute distress. No complaints of pain. I do not believe the patient needs CT head because he did not lose consciousness, denies hitting his head, and is not anticoagulated. I spoke to Dr. Jeneen Rinks regarding this patient who has seen and evaluated the patient.  His vitals are stable and his labs are not concerning at this time.  They are comparable to previous labs.  His CXR is negative for pneumonia.  He does not have rhabdomyolysis. Doppler  negative for DVT.  No UTI. He has an L2 compression fracture but daughter things this may be chronic. EKG shows paced rhythm and troponin is negative. I thoroughly discussed labs, imaging and follow up with urology. He also has close follow up with his pcp and is monitored regularly.  I feel comfortable sending the patient home and I have discussed return precautions with both the patient and daughter who verbally agree with the plan.  Ottie Glazier, PA-C 03/09/15 [place], MD 03/18/15 [phone]

## 2015-03-09 NOTE — Discharge Instructions (Signed)
Hematuria Follow up with urology. Hematuria is blood in your urine. It can be caused by a bladder infection, kidney infection, prostate infection, kidney stone, or cancer of your urinary tract. Infections can usually be treated with medicine, and a kidney stone usually will pass through your urine. If neither of these is the cause of your hematuria, further workup to find out the reason may be needed. It is very important that you tell your health care provider about any blood you see in your urine, even if the blood stops without treatment or happens without causing pain. Blood in your urine that happens and then stops and then happens again can be a symptom of a very serious condition. Also, pain is not a symptom in the initial stages of many urinary cancers. HOME CARE INSTRUCTIONS   Drink lots of fluid, 3-4 quarts a day. If you have been diagnosed with an infection, cranberry juice is especially recommended, in addition to large amounts of water.  Avoid caffeine, tea, and carbonated beverages because they tend to irritate the bladder.  Avoid alcohol because it may irritate the prostate.  Take all medicines as directed by your health care provider.  If you were prescribed an antibiotic medicine, finish it all even if you start to feel better.  If you have been diagnosed with a kidney stone, follow your health care provider's instructions regarding straining your urine to catch the stone.  Empty your bladder often. Avoid holding urine for long periods of time.  After a bowel movement, women should cleanse front to back. Use each tissue only once.  Empty your bladder before and after sexual intercourse if you are a male. SEEK MEDICAL CARE IF:  You develop back pain.  You have a fever.  You have a feeling of sickness in your stomach (nausea) or vomiting.  Your symptoms are not better in 3 days. Return sooner if you are getting worse. SEEK IMMEDIATE MEDICAL CARE IF:   You develop  severe vomiting and are unable to keep the medicine down.  You develop severe back or abdominal pain despite taking your medicines.  You begin passing a large amount of blood or clots in your urine.  You feel extremely weak or faint, or you pass out. MAKE SURE YOU:   Understand these instructions.  Will watch your condition.  Will get help right away if you are not doing well or get worse. Document Released: 07/09/2005 Document Revised: 11/23/2013 Document Reviewed: 03/09/2013 [facility] Patient Information 2015 [place], [place]. This information is not intended to replace advice given to you by your health care provider. Make sure you discuss any questions you have with your health care provider. Fall Prevention and Home Safety Falls cause injuries and can affect all age groups. It is possible to prevent falls.  HOW TO PREVENT FALLS  Wear shoes with rubber soles that do not have an opening for your toes.  Keep the inside and outside of your house well lit.  Use night lights throughout your home.  Remove clutter from floors.  Clean up floor spills.  Remove throw rugs or fasten them to the floor with carpet tape.  Do not place electrical cords across pathways.  Put grab bars by your tub, shower, and toilet. Do not use towel bars as grab bars.  Put handrails on both sides of the stairway. Fix loose handrails.  Do not climb on stools or stepladders, if possible.  Do not wax your floors.  Repair uneven or unsafe sidewalks,  walkways, or stairs.  Keep items you use a lot within reach.  Be aware of pets.  Keep emergency numbers next to the telephone.  Put smoke detectors in your home and near bedrooms. Ask your doctor what other things you can do to prevent falls. Document Released: 05/05/2009 Document Revised: 01/08/2012 Document Reviewed: 10/09/2011 [facility] Patient Information 2015 [place], [place]. This information is not intended to replace advice given to you by your  health care provider. Make sure you discuss any questions you have with your health care provider.

## 2015-03-09 NOTE — ED Notes (Signed)
Went to obtain EKG, patient gone to X Ray and ultrasound so unable to obtain

## 2015-03-22 ENCOUNTER — Emergency Department (HOSPITAL_BASED_OUTPATIENT_CLINIC_OR_DEPARTMENT_OTHER)
Admission: EM | Admit: 2015-03-22 | Discharge: 2015-03-22 | Disposition: A | Discharge status: Home / Self Care | Payer: Medicare HMO | Attending: Emergency Medicine | Admitting: Emergency Medicine

## 2015-03-22 ENCOUNTER — Encounter (HOSPITAL_BASED_OUTPATIENT_CLINIC_OR_DEPARTMENT_OTHER): Payer: Self-pay | Admitting: Emergency Medicine

## 2015-03-22 ENCOUNTER — Emergency Department (HOSPITAL_BASED_OUTPATIENT_CLINIC_OR_DEPARTMENT_OTHER): Payer: Medicare HMO

## 2015-03-22 DIAGNOSIS — Z79899 Other long term (current) drug therapy: Secondary | ICD-10-CM | POA: Diagnosis not present

## 2015-03-22 DIAGNOSIS — Z950 Presence of cardiac pacemaker: Secondary | ICD-10-CM | POA: Diagnosis not present

## 2015-03-22 DIAGNOSIS — W01198A Fall on same level from slipping, tripping and stumbling with subsequent striking against other object, initial encounter: Secondary | ICD-10-CM | POA: Insufficient documentation

## 2015-03-22 DIAGNOSIS — Y998 Other external cause status: Secondary | ICD-10-CM | POA: Insufficient documentation

## 2015-03-22 DIAGNOSIS — Y9389 Activity, other specified: Secondary | ICD-10-CM | POA: Insufficient documentation

## 2015-03-22 DIAGNOSIS — Z7982 Long term (current) use of aspirin: Secondary | ICD-10-CM | POA: Diagnosis not present

## 2015-03-22 DIAGNOSIS — M1990 Unspecified osteoarthritis, unspecified site: Secondary | ICD-10-CM | POA: Insufficient documentation

## 2015-03-22 DIAGNOSIS — Z87438 Personal history of other diseases of male genital organs: Secondary | ICD-10-CM | POA: Insufficient documentation

## 2015-03-22 DIAGNOSIS — Z7952 Long term (current) use of systemic steroids: Secondary | ICD-10-CM | POA: Diagnosis not present

## 2015-03-22 DIAGNOSIS — S59902A Unspecified injury of left elbow, initial encounter: Secondary | ICD-10-CM | POA: Diagnosis present

## 2015-03-22 DIAGNOSIS — Z8639 Personal history of other endocrine, nutritional and metabolic disease: Secondary | ICD-10-CM | POA: Insufficient documentation

## 2015-03-22 DIAGNOSIS — I4891 Unspecified atrial fibrillation: Secondary | ICD-10-CM | POA: Diagnosis not present

## 2015-03-22 DIAGNOSIS — Y9289 Other specified places as the place of occurrence of the external cause: Secondary | ICD-10-CM | POA: Diagnosis not present

## 2015-03-22 DIAGNOSIS — Z85841 Personal history of malignant neoplasm of brain: Secondary | ICD-10-CM | POA: Diagnosis not present

## 2015-03-22 DIAGNOSIS — Z8711 Personal history of peptic ulcer disease: Secondary | ICD-10-CM | POA: Insufficient documentation

## 2015-03-22 DIAGNOSIS — R4182 Altered mental status, unspecified: Secondary | ICD-10-CM | POA: Diagnosis not present

## 2015-03-22 DIAGNOSIS — H409 Unspecified glaucoma: Secondary | ICD-10-CM | POA: Insufficient documentation

## 2015-03-22 DIAGNOSIS — S0990XA Unspecified injury of head, initial encounter: Secondary | ICD-10-CM | POA: Diagnosis not present

## 2015-03-22 DIAGNOSIS — Z87448 Personal history of other diseases of urinary system: Secondary | ICD-10-CM | POA: Diagnosis not present

## 2015-03-22 DIAGNOSIS — I10 Essential (primary) hypertension: Secondary | ICD-10-CM | POA: Insufficient documentation

## 2015-03-22 DIAGNOSIS — W19XXXA Unspecified fall, initial encounter: Secondary | ICD-10-CM

## 2015-03-22 DIAGNOSIS — S5002XA Contusion of left elbow, initial encounter: Secondary | ICD-10-CM

## 2015-03-22 LAB — CBC WITH DIFFERENTIAL/PLATELET
BASOS ABS: 0 10*3/uL (ref 0.0–0.1)
Basophils Relative: 0 % (ref 0–1)
Eosinophils Absolute: 0.1 10*3/uL (ref 0.0–0.7)
Eosinophils Relative: 0 % (ref 0–5)
HEMATOCRIT: 37.8 % — AB (ref 39.0–52.0)
HEMOGLOBIN: 12.3 g/dL — AB (ref 13.0–17.0)
LYMPHS PCT: 16 % (ref 12–46)
Lymphs Abs: 1.8 10*3/uL (ref 0.7–4.0)
MCH: 31.9 pg (ref 26.0–34.0)
MCHC: 32.5 g/dL (ref 30.0–36.0)
MCV: 97.9 fL (ref 78.0–100.0)
MONO ABS: 1.7 10*3/uL — AB (ref 0.1–1.0)
MONOS PCT: 14 % — AB (ref 3–12)
NEUTROS ABS: 8.1 10*3/uL — AB (ref 1.7–7.7)
NEUTROS PCT: 70 % (ref 43–77)
Platelets: 226 10*3/uL (ref 150–400)
RBC: 3.86 MIL/uL — ABNORMAL LOW (ref 4.22–5.81)
RDW: 14.2 % (ref 11.5–15.5)
WBC: 11.7 10*3/uL — ABNORMAL HIGH (ref 4.0–10.5)

## 2015-03-22 LAB — URINE MICROSCOPIC-ADD ON

## 2015-03-22 LAB — URINALYSIS, ROUTINE W REFLEX MICROSCOPIC
BILIRUBIN URINE: NEGATIVE
Glucose, UA: NEGATIVE mg/dL
Hgb urine dipstick: NEGATIVE
Ketones, ur: NEGATIVE mg/dL
Leukocytes, UA: NEGATIVE
NITRITE: NEGATIVE
PH: 7.5 (ref 5.0–8.0)
Protein, ur: 100 mg/dL — AB
SPECIFIC GRAVITY, URINE: 1.013 (ref 1.005–1.030)
Urobilinogen, UA: 0.2 mg/dL (ref 0.0–1.0)

## 2015-03-22 LAB — COMPREHENSIVE METABOLIC PANEL
ALBUMIN: 3.5 g/dL (ref 3.5–5.0)
ALT: 11 U/L — ABNORMAL LOW (ref 17–63)
ANION GAP: 7 (ref 5–15)
AST: 16 U/L (ref 15–41)
Alkaline Phosphatase: 60 U/L (ref 38–126)
BUN: 27 mg/dL — ABNORMAL HIGH (ref 6–20)
CO2: 26 mmol/L (ref 22–32)
Calcium: 8.3 mg/dL — ABNORMAL LOW (ref 8.9–10.3)
Chloride: 105 mmol/L (ref 101–111)
Creatinine, Ser: 1.69 mg/dL — ABNORMAL HIGH (ref 0.61–1.24)
GFR calc Af Amer: 37 mL/min — ABNORMAL LOW (ref >60)
GFR calc non Af Amer: 32 mL/min — ABNORMAL LOW (ref >60)
GLUCOSE: 124 mg/dL — AB (ref 65–99)
POTASSIUM: 4 mmol/L (ref 3.5–5.1)
SODIUM: 138 mmol/L (ref 135–145)
Total Bilirubin: 1 mg/dL (ref 0.3–1.2)
Total Protein: 6.6 g/dL (ref 6.5–8.1)

## 2015-03-22 MED ORDER — CEPHALEXIN 500 MG PO CAPS: 500.0000 mg | ORAL_CAPSULE | Freq: Four times a day (QID) | ORAL | Status: DC

## 2015-03-22 MED ORDER — LIDOCAINE HCL (PF) 1 % IJ SOLN
5.0000 mL | Freq: Once | INTRAMUSCULAR | Status: AC
  Administered 2015-03-22: 5 mL via INTRADERMAL

## 2015-03-22 MED ORDER — LIDOCAINE HCL (PF) 1 % IJ SOLN
INTRAMUSCULAR | Status: AC
  Filled 2015-03-22: qty 5

## 2015-03-22 NOTE — Discharge Instructions (Signed)
We will call you if your cultures indicate you require further treatment.  Keflex as prescribed.  Return to the emergency department if symptoms significantly worsen or change.   Head Injury You have received a head injury. It does not appear serious at this time. Headaches and vomiting are common following head injury. It should be easy to awaken from sleeping. Sometimes it is necessary for you to stay in the emergency department for a while for observation. Sometimes admission to the hospital may be needed. After injuries such as yours, most problems occur within the first 24 hours, but side effects may occur up to 7-10 days after the injury. It is important for you to carefully monitor your condition and contact your health care provider or seek immediate medical care if there is a change in your condition. WHAT ARE THE TYPES OF HEAD INJURIES? Head injuries can be as minor as a bump. Some head injuries can be more severe. More severe head injuries include:  A jarring injury to the brain (concussion).  A bruise of the brain (contusion). This mean there is bleeding in the brain that can cause swelling.  A cracked skull (skull fracture).  Bleeding in the brain that collects, clots, and forms a bump (hematoma). WHAT CAUSES A HEAD INJURY? A serious head injury is most likely to happen to someone who is in a car wreck and is not wearing a seat belt. Other causes of major head injuries include bicycle or motorcycle accidents, sports injuries, and falls. HOW ARE HEAD INJURIES DIAGNOSED? A complete history of the event leading to the injury and your current symptoms will be helpful in diagnosing head injuries. Many times, pictures of the brain, such as CT or MRI are needed to see the extent of the injury. Often, an overnight hospital stay is necessary for observation.  WHEN SHOULD I SEEK IMMEDIATE MEDICAL CARE?  You should get help right away if:  You have confusion or drowsiness.  You feel  sick to your stomach (nauseous) or have continued, forceful vomiting.  You have dizziness or unsteadiness that is getting worse.  You have severe, continued headaches not relieved by medicine. Only take over-the-counter or prescription medicines for pain, fever, or discomfort as directed by your health care provider.  You do not have normal function of the arms or legs or are unable to walk.  You notice changes in the black spots in the center of the colored part of your eye (pupil).  You have a clear or bloody fluid coming from your nose or ears.  You have a loss of vision. During the next 24 hours after the injury, you must stay with someone who can watch you for the warning signs. This person should contact local emergency services (911 in the [place] if you have seizures, you become unconscious, or you are unable to wake up. HOW CAN I PREVENT A HEAD INJURY IN THE FUTURE? The most important factor for preventing major head injuries is avoiding motor vehicle accidents. To minimize the potential for damage to your head, it is crucial to wear seat belts while riding in motor vehicles. Wearing helmets while bike riding and playing collision sports (like football) is also helpful. Also, avoiding dangerous activities around the house will further help reduce your risk of head injury.  WHEN CAN I RETURN TO NORMAL ACTIVITIES AND ATHLETICS? You should be reevaluated by your health care provider before returning to these activities. If you have any of the following symptoms, you should  not return to activities or contact sports until 1 week after the symptoms have stopped:  Persistent headache.  Dizziness or vertigo.  Poor attention and concentration.  Confusion.  Memory problems.  Nausea or vomiting.  Fatigue or tire easily.  Irritability.  Intolerant of bright lights or loud noises.  Anxiety or depression.  Disturbed sleep. MAKE SURE YOU:   Understand these instructions.  Will  watch your condition.  Will get help right away if you are not doing well or get worse. Document Released: 07/09/2005 Document Revised: 07/14/2013 Document Reviewed: 03/16/2013 [facility] Patient Information 2015 [place], [place]. This information is not intended to replace advice given to you by your health care provider. Make sure you discuss any questions you have with your health care provider.  Contusion A contusion is a deep bruise. Contusions are the result of an injury that caused bleeding under the skin. The contusion may turn blue, purple, or yellow. Minor injuries will give you a painless contusion, but more severe contusions may stay painful and swollen for a few weeks.  CAUSES  A contusion is usually caused by a blow, trauma, or direct force to an area of the body. SYMPTOMS   Swelling and redness of the injured area.  Bruising of the injured area.  Tenderness and soreness of the injured area.  Pain. DIAGNOSIS  The diagnosis can be made by taking a history and physical exam. An X-ray, CT scan, or MRI may be needed to determine if there were any associated injuries, such as fractures. TREATMENT  Specific treatment will depend on what area of the body was injured. In general, the best treatment for a contusion is resting, icing, elevating, and applying cold compresses to the injured area. Over-the-counter medicines may also be recommended for pain control. Ask your caregiver what the best treatment is for your contusion. HOME CARE INSTRUCTIONS   Put ice on the injured area.  Put ice in a plastic bag.  Place a towel between your skin and the bag.  Leave the ice on for 15-20 minutes, 3-4 times a day, or as directed by your health care provider.  Only take over-the-counter or prescription medicines for pain, discomfort, or fever as directed by your caregiver. Your caregiver may recommend avoiding anti-inflammatory medicines (aspirin, ibuprofen, and naproxen) for 48 hours because  these medicines may increase bruising.  Rest the injured area.  If possible, elevate the injured area to reduce swelling. SEEK IMMEDIATE MEDICAL CARE IF:   You have increased bruising or swelling.  You have pain that is getting worse.  Your swelling or pain is not relieved with medicines. MAKE SURE YOU:   Understand these instructions.  Will watch your condition.  Will get help right away if you are not doing well or get worse. Document Released: 04/18/2005 Document Revised: 07/14/2013 Document Reviewed: 05/14/2011 [facility] Patient Information 2015 [place], [place]. This information is not intended to replace advice given to you by your health care provider. Make sure you discuss any questions you have with your health care provider.

## 2015-03-22 NOTE — ED Notes (Signed)
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 Pt daughter states today the pt has been displaying AMS today only, but fell and struck his head x 2 days ago. Pt also has large hematoma to L elbow that is round, soft, and hot to touch. Pt is answering questions with encouragement, eyes open.

## 2015-03-22 NOTE — ED Provider Notes (Signed)
CSN: [id number]     Arrival date & time 03/22/15  1328 History   First MD Initiated Contact with Patient 03/22/15 1340     Chief Complaint  Patient presents with  . Fall  . Altered Mental Status     (Consider location/radiation/quality/duration/timing/severity/associated sxs/prior Treatment) HPI Comments: Patient is a 81 year old male with past medical history of hypertension. He is brought by his daughter for evaluation after a fall. He apparently lost his balance and fell 2 days ago and the daughter is concerned he hit his head on the floor. Since that time he has appeared more confused and less energetic and is brought for evaluation of this. The patient denies specific aches or pains. He does report swelling to his left elbow.  Patient is a 81 y.o. male presenting with fall and altered mental status. The history is provided by the patient.  Fall This is a new problem. The current episode started 2 days ago. The problem occurs constantly. The problem has not changed since onset.Nothing aggravates the symptoms. Nothing relieves the symptoms.  Altered Mental Status   Past Medical History  Diagnosis Date  . Hypertension   . Third degree AV block   . Pacemaker   . Renal disorder   . Renal insufficiency   . BPH (benign prostatic hyperplasia)   . Glaucoma   . Macular degeneration   . A-fib   . Peptic ulcer disease with hemorrhage   . Vitamin D deficiency   . Arthritis   . Falls frequently   . Cancer 1996    brain tumor   Past Surgical History  Procedure Laterality Date  . Brain meningioma excision    . Pacemaker insertion    . Appendectomy    . Tonsillectomy    . Eye surgery    . External ear surgery     History reviewed. No pertinent family history. Social History  Substance Use Topics  . Smoking status: Never Smoker   . Smokeless tobacco: None  . Alcohol Use: Yes    Review of Systems  All other systems reviewed and are negative.     Allergies  Review of  patient's allergies indicates no known allergies.  Home Medications   Prior to Admission medications   Medication Sig Start Date End Date Taking? Authorizing Provider  amLODipine (NORVASC) 5 MG tablet Take 5 mg by mouth daily.    Historical Provider, MD  aspirin EC 81 MG tablet Take 81 mg by mouth daily.    Historical Provider, MD  dorzolamide (TRUSOPT) 2 % ophthalmic solution 1 drop 3 (three) times daily.    Historical Provider, MD  hydrochlorothiazide (MICROZIDE) 12.5 MG capsule Take 12.5 mg by mouth daily.    Historical Provider, MD  latanoprost (XALATAN) 0.005 % ophthalmic solution 1 drop at bedtime.    Historical Provider, MD  metoprolol succinate (TOPROL-XL) 100 MG 24 hr tablet Take 100 mg by mouth daily. Take with or immediately following a meal.    Historical Provider, MD  predniSONE (DELTASONE) 10 MG tablet Take 2 tablets (20 mg total) by mouth daily. 12/20/14   Charlesetta Shanks, MD   BP 159/64 mmHg  Pulse 62  Temp(Src) 99 F (37.2 C) (Oral)  Resp 20  Ht 5\' 5"  (1.651 m)  Wt 160 lb (72.576 kg)  BMI 26.63 kg/m2  SpO2 95% Physical Exam  Constitutional: He is oriented to person, place, and time. He appears well-developed and well-nourished. No distress.  HENT:  Head: Normocephalic and atraumatic.  Neck:  Normal range of motion. Neck supple.  Cardiovascular: Normal rate, regular rhythm and normal heart sounds.   Pulmonary/Chest: Effort normal and breath sounds normal. No respiratory distress. He has no wheezes.  Abdominal: Soft. Bowel sounds are normal. He exhibits no distension. There is no tenderness.  Musculoskeletal: He exhibits edema.  There is 2+ pitting edema of the right lower extremity and 1+ pitting edema of the left lower extremity. According to the daughter this is his baseline.  The left elbow is noted to have a swollen olecranon bursa. It is somewhat warm to the touch. He has good range of motion with minimal discomfort.  Neurological: He is alert and oriented to  person, place, and time. No cranial nerve deficit. He exhibits normal muscle tone. Coordination normal.  Skin: Skin is warm and dry. He is not diaphoretic.  Nursing note and vitals reviewed.   ED Course  Procedures (including critical care time) Labs Review Labs Reviewed  COMPREHENSIVE METABOLIC PANEL  CBC WITH DIFFERENTIAL/PLATELET  URINALYSIS, ROUTINE W REFLEX MICROSCOPIC (NOT AT [facility])    Imaging Review No results found. I have personally reviewed and evaluated these images and lab results as part of my medical decision-making.   EKG Interpretation None      MDM   Final diagnoses:  None    Patient is a 81 year old male brought by his daughter for evaluation of a fall. He was apparently ambulating with his walker when he lost his balance and fell to the side. He has seemed weaker since this time. The daughter is also noted swelling of the left elbow.   Workup reveals unremarkable laboratory studies, clear urinalysis, and CT of the head which is unremarkable. X-rays of his elbow reveal no acute fracture. He appears to have an olecranon bursitis. This was aspirated of approximately 15 mL of bloody, serous fluid. This was sent for culture.  I discussed the disposition with the daughter. We have made the decision to discharge him to home. The daughter is a physician herself and will be staying with him for the next several days. If anything turns up in his culture, they will be notified. The daughter also understands to return if his symptoms significantly worsen or change.     Veryl Speak, MD 03/23/15 [phone]

## 2015-03-23 LAB — GRAM STAIN

## 2015-03-27 LAB — CULTURE, BODY FLUID W GRAM STAIN -BOTTLE

## 2015-03-27 LAB — CULTURE, BODY FLUID-BOTTLE: CULTURE: NO GROWTH

## 2015-04-05 ENCOUNTER — Telehealth (HOSPITAL_BASED_OUTPATIENT_CLINIC_OR_DEPARTMENT_OTHER): Payer: Self-pay | Admitting: Emergency Medicine

## 2016-02-07 ENCOUNTER — Emergency Department (HOSPITAL_BASED_OUTPATIENT_CLINIC_OR_DEPARTMENT_OTHER): Payer: Medicare HMO

## 2016-02-07 ENCOUNTER — Emergency Department (HOSPITAL_BASED_OUTPATIENT_CLINIC_OR_DEPARTMENT_OTHER)
Admission: EM | Admit: 2016-02-07 | Discharge: 2016-02-07 | Disposition: A | Discharge status: Other Acute Inpatient Hospital | Payer: Medicare HMO | Attending: Emergency Medicine | Admitting: Emergency Medicine

## 2016-02-07 ENCOUNTER — Encounter (HOSPITAL_BASED_OUTPATIENT_CLINIC_OR_DEPARTMENT_OTHER): Payer: Self-pay | Admitting: Emergency Medicine

## 2016-02-07 DIAGNOSIS — Z79899 Other long term (current) drug therapy: Secondary | ICD-10-CM | POA: Diagnosis not present

## 2016-02-07 DIAGNOSIS — Z85841 Personal history of malignant neoplasm of brain: Secondary | ICD-10-CM | POA: Insufficient documentation

## 2016-02-07 DIAGNOSIS — R55 Syncope and collapse: Secondary | ICD-10-CM

## 2016-02-07 DIAGNOSIS — M1990 Unspecified osteoarthritis, unspecified site: Secondary | ICD-10-CM | POA: Diagnosis not present

## 2016-02-07 DIAGNOSIS — I10 Essential (primary) hypertension: Secondary | ICD-10-CM | POA: Insufficient documentation

## 2016-02-07 DIAGNOSIS — I4891 Unspecified atrial fibrillation: Secondary | ICD-10-CM | POA: Insufficient documentation

## 2016-02-07 DIAGNOSIS — Z950 Presence of cardiac pacemaker: Secondary | ICD-10-CM | POA: Insufficient documentation

## 2016-02-07 DIAGNOSIS — Z7982 Long term (current) use of aspirin: Secondary | ICD-10-CM | POA: Diagnosis not present

## 2016-02-07 LAB — URINE MICROSCOPIC-ADD ON: Squamous Epithelial / HPF: NONE SEEN

## 2016-02-07 LAB — COMPREHENSIVE METABOLIC PANEL WITH GFR
ALT: 11 U/L — ABNORMAL LOW (ref 17–63)
AST: 23 U/L (ref 15–41)
Albumin: 3.8 g/dL (ref 3.5–5.0)
Alkaline Phosphatase: 63 U/L (ref 38–126)
Anion gap: 8 (ref 5–15)
BUN: 30 mg/dL — ABNORMAL HIGH (ref 6–20)
CO2: 27 mmol/L (ref 22–32)
Calcium: 8.6 mg/dL — ABNORMAL LOW (ref 8.9–10.3)
Chloride: 106 mmol/L (ref 101–111)
Creatinine, Ser: 1.81 mg/dL — ABNORMAL HIGH (ref 0.61–1.24)
GFR calc Af Amer: 34 mL/min — ABNORMAL LOW
GFR calc non Af Amer: 29 mL/min — ABNORMAL LOW
Glucose, Bld: 150 mg/dL — ABNORMAL HIGH (ref 65–99)
Potassium: 4.4 mmol/L (ref 3.5–5.1)
Sodium: 141 mmol/L (ref 135–145)
Total Bilirubin: 0.9 mg/dL (ref 0.3–1.2)
Total Protein: 6.9 g/dL (ref 6.5–8.1)

## 2016-02-07 LAB — CBC WITH DIFFERENTIAL/PLATELET
Basophils Absolute: 0 K/uL (ref 0.0–0.1)
Basophils Relative: 0 %
Eosinophils Absolute: 0.4 K/uL (ref 0.0–0.7)
Eosinophils Relative: 4 %
HCT: 41.3 % (ref 39.0–52.0)
Hemoglobin: 13.8 g/dL (ref 13.0–17.0)
Lymphocytes Relative: 26 %
Lymphs Abs: 2.2 K/uL (ref 0.7–4.0)
MCH: 32.5 pg (ref 26.0–34.0)
MCHC: 33.4 g/dL (ref 30.0–36.0)
MCV: 97.4 fL (ref 78.0–100.0)
Monocytes Absolute: 0.9 K/uL (ref 0.1–1.0)
Monocytes Relative: 10 %
Neutro Abs: 5.1 K/uL (ref 1.7–7.7)
Neutrophils Relative %: 60 %
Platelets: 226 K/uL (ref 150–400)
RBC: 4.24 MIL/uL (ref 4.22–5.81)
RDW: 14 % (ref 11.5–15.5)
WBC: 8.7 K/uL (ref 4.0–10.5)

## 2016-02-07 LAB — URINALYSIS, ROUTINE W REFLEX MICROSCOPIC
Bilirubin Urine: NEGATIVE
Glucose, UA: NEGATIVE mg/dL
Ketones, ur: NEGATIVE mg/dL
Leukocytes, UA: NEGATIVE
Nitrite: NEGATIVE
Protein, ur: 100 mg/dL — AB
Specific Gravity, Urine: 1.012 (ref 1.005–1.030)
pH: 7 (ref 5.0–8.0)

## 2016-02-07 LAB — TROPONIN I
Troponin I: 0.05 ng/mL
Troponin I: 0.05 ng/mL

## 2016-02-07 LAB — OCCULT BLOOD X 1 CARD TO LAB, STOOL: Fecal Occult Bld: NEGATIVE

## 2016-02-07 LAB — CBG MONITORING, ED: Glucose-Capillary: 148 mg/dL — ABNORMAL HIGH (ref 65–99)

## 2016-02-07 MED ORDER — ASPIRIN 81 MG PO CHEW
324.0000 mg | CHEWABLE_TABLET | Freq: Once | ORAL | Status: AC
  Administered 2016-02-07: 324 mg via ORAL
  Filled 2016-02-07: qty 4

## 2016-02-07 NOTE — ED Notes (Signed)
Samantha PA-C in room with patient now.

## 2016-02-07 NOTE — ED Provider Notes (Signed)
CSN: [id number]     Arrival date & time 02/07/16  1617 History  By signing my name below, I, Reola Mosher, attest that this documentation has been prepared under the direction and in the presence of Haizlee Henton, PA-C.  Electronically Signed: Reola Mosher, ED Scribe. 02/07/2016. 5:00 PM.   Chief Complaint  Patient presents with  . Loss of Consciousness   The history is provided by the patient and a relative. No language interpreter was used.   HPI Comments: Henock Banaga is a 82 y.o. male with a PMHx of HTN, third degree AV block w/ [employer] pacemaker placement, BPH, A-fib, glaucoma, and chronic renal insufficieny who presents to the Emergency Department complaining of sudden onset, resolved syncopal episode that occurred ~2.5 hours prior to coming into the ED. No head injury, because his daughter was beside him who eased him onto the ground. Prior to the episode he notes that he was mildly dizzy, but he denies any CP or SOB. Pt's daughter also notes that he was having urinary frequency and mild fatigue this morning prior to the episode which is abnormal for him. She states that he was harder to get out of bed, but once he did he was able to perform all of his normal morning routines until the episode of syncope. Just prior to the episode he was not following commands as well as he normally does. She also checked his BP when he got out of bed and reports that it was 160/70. Pt's daughter reports that his BP had dropped significantly after the episode and it was 108/70, and he may have had an episode of vasovagal syncope. He was A&Ox3 immediately after the episode, per his daughter. She gave him normal breakfast foods after the episode, including a moderate amount of juice. He has not had a syncopal episode since. His daughter notes that prior to going to bed last night that the patient was not experiencing any symptoms, and acting completely at baseline. Pt's last urine output was 5  hours prior to coming into the ED, and was "clear as water"; however, his daughter states that it is normally closer to an orange color. Pt has had a UTI in the past, but it is not a "frequent issue", and he does not have problems with urinary retention. No hx of CHF. His pacemaker was last interrogated on 12/22/15 after a 13-hour episode of A-fib, but his daughter states that his A-fib is "rare issue" for him. Pt is not on anticoagulants. Pt and daughter and not complaining of any other symptoms.   Past Medical History  Diagnosis Date  . Hypertension   . Third degree AV block ([place])   . Pacemaker   . Renal disorder   . Renal insufficiency   . BPH (benign prostatic hyperplasia)   . Glaucoma   . Macular degeneration   . A-fib ([place])   . Peptic ulcer disease with hemorrhage   . Vitamin D deficiency   . Arthritis   . Falls frequently   . Cancer [facility]) 1996    brain tumor   Past Surgical History  Procedure Laterality Date  . Brain meningioma excision    . Pacemaker insertion    . Appendectomy    . Tonsillectomy    . Eye surgery    . External ear surgery     History reviewed. No pertinent family history. Social History  Substance Use Topics  . Smoking status: Never Smoker   . Smokeless tobacco: None  .  Alcohol Use: Yes    Review of Systems A complete 10 system review of systems was obtained and all systems are negative except as noted in the HPI and PMH.   Allergies  Review of patient's allergies indicates no known allergies.  Home Medications   Prior to Admission medications   Medication Sig Start Date End Date Taking? Authorizing Provider  amLODipine (NORVASC) 5 MG tablet Take 5 mg by mouth daily.    Historical Provider, MD  aspirin EC 81 MG tablet Take 81 mg by mouth daily.    Historical Provider, MD  cephALEXin (KEFLEX) 500 MG capsule Take 1 capsule (500 mg total) by mouth 4 (four) times daily. 03/22/15   Veryl Speak, MD  dorzolamide (TRUSOPT) 2 % ophthalmic solution 1  drop 3 (three) times daily.    Historical Provider, MD  hydrochlorothiazide (MICROZIDE) 12.5 MG capsule Take 12.5 mg by mouth daily.    Historical Provider, MD  latanoprost (XALATAN) 0.005 % ophthalmic solution 1 drop at bedtime.    Historical Provider, MD  metoprolol succinate (TOPROL-XL) 100 MG 24 hr tablet Take 100 mg by mouth daily. Take with or immediately following a meal.    Historical Provider, MD  predniSONE (DELTASONE) 10 MG tablet Take 2 tablets (20 mg total) by mouth daily. 12/20/14   Charlesetta Shanks, MD   BP 138/81 mmHg  Pulse 68  Temp(Src) 98 F (36.7 C) (Oral)  Resp 18  Wt 160 lb (72.576 kg)  SpO2 100%   Physical Exam  Constitutional: He is oriented to person, place, and time. He appears well-developed and well-nourished. No distress.  Elderly male  HENT:  Head: Normocephalic and atraumatic.  Mouth/Throat: No oropharyngeal exudate.  Eyes: Conjunctivae and EOM are normal. Pupils are equal, round, and reactive to light. Right eye exhibits no discharge. Left eye exhibits no discharge. No scleral icterus.  Cardiovascular: Normal rate and intact distal pulses.  Exam reveals no gallop and no friction rub.   No murmur heard. Pacemaker in place  Pulmonary/Chest: Effort normal and breath sounds normal. No respiratory distress. He has no wheezes. He has no rales. He exhibits no tenderness.  Abdominal: Soft. He exhibits no distension. There is no tenderness. There is no guarding.  Musculoskeletal: Normal range of motion. He exhibits no edema.  Neurological: He is alert and oriented to person, place, and time. No cranial nerve deficit.  Strength 3/5 throughout, chronic. No sensory deficits. No facial droop. No pronator drift. Mild expressive aphasia, chronic. No slurred speech.    Skin: Skin is warm and dry. No rash noted. He is not diaphoretic. No erythema. No pallor.  Psychiatric: He has a normal mood and affect. His behavior is normal.  Nursing note and vitals reviewed.  ED  Course  Procedures   DIAGNOSTIC STUDIES: Oxygen Saturation is 100% on RA, normal by my interpretation.   COORDINATION OF CARE: 5:00 PM-Discussed next steps with pt and daughter including syncope workup. Pt verbalized understanding and is agreeable with the plan.   Labs Review Labs Reviewed  COMPREHENSIVE METABOLIC PANEL - Abnormal; Notable for the following:    Glucose, Bld 150 (*)    BUN 30 (*)    Creatinine, Ser 1.81 (*)    Calcium 8.6 (*)    ALT 11 (*)    GFR calc non Af Amer 29 (*)    GFR calc Af Amer 34 (*)    All other components within normal limits  TROPONIN I - Abnormal; Notable for the following:  Troponin I 0.05 (*)    All other components within normal limits  URINALYSIS, ROUTINE W REFLEX MICROSCOPIC (NOT AT [facility]) - Abnormal; Notable for the following:    Hgb urine dipstick TRACE (*)    Protein, ur 100 (*)    All other components within normal limits  URINE MICROSCOPIC-ADD ON - Abnormal; Notable for the following:    Bacteria, UA RARE (*)    All other components within normal limits  TROPONIN I - Abnormal; Notable for the following:    Troponin I 0.05 (*)    All other components within normal limits  CBG MONITORING, ED - Abnormal; Notable for the following:    Glucose-Capillary 148 (*)    All other components within normal limits  URINE CULTURE  CBC WITH DIFFERENTIAL/PLATELET  OCCULT BLOOD X 1 CARD TO LAB, STOOL    Imaging Review Ct Head Wo Contrast  02/07/2016  CLINICAL DATA:  Altered mental status, mild aphasia. Pre syncopal episode today. Fell 1 week ago without head injury. History of atrial fibrillation and pacemaker, cancer. EXAM: CT HEAD WITHOUT CONTRAST TECHNIQUE: Contiguous axial images were obtained from the base of the skull through the vertex without intravenous contrast. COMPARISON:  CT HEAD March 22, 2015 FINDINGS: INTRACRANIAL CONTENTS: The ventricles and sulci are normal for age. No intraparenchymal hemorrhage, mass effect nor midline shift.  Patchy supratentorial white matter hypodensities are less than expected for patient's age and though non-specific likely represent chronic small vessel ischemic disease. LEFT frontal lobe encephalomalacia. New RIGHT inferior basal ganglia lacunar infarct, does not appear acute. No acute large vascular territory infarcts. No abnormal extra-axial fluid collections. Basal cisterns are patent. Moderate calcific atherosclerosis of the carotid siphons. ORBITS: The included ocular globes and orbital contents are non-suspicious. Status post bilateral ocular lens implants. SINUSES: Mucosal retention cysts.  Bilateral mastoid effusions. SKULL/SOFT TISSUES: No skull fracture. No significant soft tissue swelling. Old LEFT craniotomy with multiple bilateral burr holes. IMPRESSION: No acute intracranial process. New RIGHT inferior basal ganglia lacunar infarct does not appear acute. Chronic changes including LEFT frontal encephalomalacia, status post craniotomy. Electronically Signed   By: Elon Alas M.D.   On: 02/07/2016 19:27    I have personally reviewed and evaluated these images and lab results as part of my medical decision-making.   EKG Interpretation None      MDM   Final diagnoses:  Syncope, unspecified syncope type   82 year old male with a past medical history of third-degree AV block in atrial fibrillation with a dual-chamber pacemaker in place, BPH, HTN presents to the ED today after a syncopal episode. Upon awakening this morning patient was more tired than normal and had urinary urgency and frequency. Per patient's daughter who is primary caretaker he urinated a large amount and shortly after was attempting to ambulate when he felt dizzy and suddenly became limp and passed out. He was lowered to the floor, no trauma or injury noted. Per patient's daughter he was alert and oriented seconds later and was back to his baseline. He did not experience any chest pain during the episode. Patient's  daughter has medical background and felt that the patient likely experienced a vasovagal syncope given the large urinary output. She also checked his blood pressure soon after the syncopal episode and it was 120/80 which is significantly lower than his normal blood pressure. Upon presentation to the ED, patient appears well and is in no apparent distress. No focal neurological deficits noted on exam. Patient is currently asymptomatic and without  any complaints. Bladder scan performed and revealed 347 mL. Patient was able to urinate on his own and it does not appear to be grossly infected. Patient's pacemaker was also interrogated which revealed 37 seconds of rapid atrial episodes that occurred yesterday. No heart rate ventricular response associated with this..Per pts daughter, pt had an unwitnessed fall 1 week ago and daughter feels like pt has been leaning to the right since that time.  CT head obtained which does not reveal any acute abnormality. There is a new right lacunar infarct but does not appear to be acute. Patient's creatinine is elevated but per patient's daughter this is his baseline as he has chronic renal insufficiency. Troponin is mildly elevated at 0.05. Given that patient has elevated troponin in the setting of syncope would recommend adMission for observation and serial troponin patient was given 324 mg of aspirin in the ED. Discussed in detail with patient and his family members risks versus benefits of admission. Shared medical decision-making occurred and patient and family would like to be admitted to [facility]. Spoke with Dr. Janit Pagan with [facility] hospitalist who will admit pt to telemetry for obs. Transfer initiated.  Patient was discussed with and seen by Dr. Winfred Leeds who agrees with the treatment plan.       I personally performed the services described in this documentation, which was scribed in my presence. The recorded information has been reviewed  and is accurate.    Dondra Spry Platter, PA-C 02/07/16 2254  Orlie Dakin, MD 02/07/16 [phone]

## 2016-02-07 NOTE — ED Notes (Signed)
Dr Winfred Leeds in room with patient now.

## 2016-02-07 NOTE — ED Provider Notes (Signed)
History is obtained from patient and from patient's daughter who accompanies him Patient had urinary frequency this morning and was "tired" all morning. At 1:40 PM today while standing he had a syncopal event lasting only a few seconds and fell into her arms he was lowered to the ground in a controlled fashion. He is presently asymptomatic and feels well and looks well to his daughter. Patient has history of post void residual of 100 mL per daughter. Today bladder scan shows 347 mL. Patient had last urinated at noon today. on exam patient is in no distress. Alert nontoxic lungs clear auscultation heart regular rate and rhythm abdomen nondistended nontender genitalia normal male extremities edema rectal normal tone soft brown stool. Of note patient walks with walker and has wheelchair. Chronically wears a brace on left ankle. His daughter reports that he's been leaning to the left more since he fell one week ago. Fall was unwitnessed by her. ED ECG REPORT   Date: 02/07/2016  Rate: 70  Rhythm: Paced rhythm  QRS Axis: left  Intervals: normal  ST/T Wave abnormalities: nonspecific T wave changes  Conduction Disutrbances:Wide-complex  Narrative Interpretation:   Old EKG Reviewed: unchanged  I have personally reviewed the EKG tracing and agree with the computerized printout as noted. Pacemaker was interrogated patient had 37 seconds run of atrial fibrillation yesterday with ventricular rate of 83. He had a less than 1 minute run of atrial fibrillation on 02/04/2016 and 3 runs of atrial fibrillation on July 13 all lasting less than 1 minute, all with normal ventricular rate.  Orlie Dakin, MD 02/07/16 [phone]

## 2016-02-07 NOTE — ED Notes (Signed)
Per daughter, she was helping him get ready this am and pt began not following commands as he normally does.  Pt has some mild aphasia per daughter.  Pt able to converse with no difficulty.  Pt's daughter states he passed out this morning "for just a few seconds on his way to the bathroom." daughter denies head injury as she was in the room with him the whole time. Pt after waking appeared to return to his baseline and ate an appropriate breakfast.  Prior to episode, pt had been c/o frequent urination.  Pt has not urinated now since episode this morning.  Pt states he "felt dizzy" before fainting episode this morning but denies chest pain.

## 2016-02-07 NOTE — ED Notes (Signed)
Urinary frequency this am - also tired this am which is abnormal. Daughter and Patient felt like he had a UTI and then started to get ready to go to the MD. The patient was up and getting ready to come to the Doctor and had a syncopal episode @ 1340

## 2016-02-09 LAB — URINE CULTURE: Culture: NO GROWTH

## 2017-03-09 ENCOUNTER — Emergency Department (HOSPITAL_BASED_OUTPATIENT_CLINIC_OR_DEPARTMENT_OTHER): Payer: Medicare HMO

## 2017-03-09 ENCOUNTER — Encounter (HOSPITAL_BASED_OUTPATIENT_CLINIC_OR_DEPARTMENT_OTHER): Payer: Self-pay | Admitting: Emergency Medicine

## 2017-03-09 ENCOUNTER — Emergency Department (HOSPITAL_BASED_OUTPATIENT_CLINIC_OR_DEPARTMENT_OTHER)
Admission: EM | Admit: 2017-03-09 | Discharge: 2017-03-09 | Disposition: A | Discharge status: Home / Self Care | Payer: Medicare HMO | Attending: Emergency Medicine | Admitting: Emergency Medicine

## 2017-03-09 DIAGNOSIS — I509 Heart failure, unspecified: Secondary | ICD-10-CM | POA: Insufficient documentation

## 2017-03-09 DIAGNOSIS — Z85841 Personal history of malignant neoplasm of brain: Secondary | ICD-10-CM | POA: Insufficient documentation

## 2017-03-09 DIAGNOSIS — N189 Chronic kidney disease, unspecified: Secondary | ICD-10-CM | POA: Insufficient documentation

## 2017-03-09 DIAGNOSIS — Z950 Presence of cardiac pacemaker: Secondary | ICD-10-CM | POA: Insufficient documentation

## 2017-03-09 DIAGNOSIS — R059 Cough, unspecified: Secondary | ICD-10-CM

## 2017-03-09 DIAGNOSIS — I130 Hypertensive heart and chronic kidney disease with heart failure and stage 1 through stage 4 chronic kidney disease, or unspecified chronic kidney disease: Secondary | ICD-10-CM | POA: Insufficient documentation

## 2017-03-09 DIAGNOSIS — R531 Weakness: Secondary | ICD-10-CM | POA: Insufficient documentation

## 2017-03-09 DIAGNOSIS — R05 Cough: Secondary | ICD-10-CM

## 2017-03-09 LAB — CBC WITH DIFFERENTIAL/PLATELET
BASOS PCT: 0 %
Basophils Absolute: 0 10*3/uL (ref 0.0–0.1)
Eosinophils Absolute: 0.1 10*3/uL (ref 0.0–0.7)
Eosinophils Relative: 2 %
HEMATOCRIT: 35.4 % — AB (ref 39.0–52.0)
HEMOGLOBIN: 11 g/dL — AB (ref 13.0–17.0)
LYMPHS ABS: 1.6 10*3/uL (ref 0.7–4.0)
LYMPHS PCT: 24 %
MCH: 32.4 pg (ref 26.0–34.0)
MCHC: 31.1 g/dL (ref 30.0–36.0)
MCV: 104.1 fL — AB (ref 78.0–100.0)
MONOS PCT: 13 %
Monocytes Absolute: 0.9 10*3/uL (ref 0.1–1.0)
NEUTROS ABS: 4 10*3/uL (ref 1.7–7.7)
Neutrophils Relative %: 61 %
Platelets: 300 10*3/uL (ref 150–400)
RBC: 3.4 MIL/uL — ABNORMAL LOW (ref 4.22–5.81)
RDW: 13.7 % (ref 11.5–15.5)
WBC: 6.6 10*3/uL (ref 4.0–10.5)

## 2017-03-09 LAB — COMPREHENSIVE METABOLIC PANEL
ALBUMIN: 3.2 g/dL — AB (ref 3.5–5.0)
ALT: 13 U/L — ABNORMAL LOW (ref 17–63)
ANION GAP: 8 (ref 5–15)
AST: 21 U/L (ref 15–41)
Alkaline Phosphatase: 53 U/L (ref 38–126)
BUN: 23 mg/dL — ABNORMAL HIGH (ref 6–20)
CHLORIDE: 105 mmol/L (ref 101–111)
CO2: 30 mmol/L (ref 22–32)
Calcium: 8.3 mg/dL — ABNORMAL LOW (ref 8.9–10.3)
Creatinine, Ser: 1.5 mg/dL — ABNORMAL HIGH (ref 0.61–1.24)
GFR calc non Af Amer: 36 mL/min — ABNORMAL LOW (ref >60)
GFR, EST AFRICAN AMERICAN: 42 mL/min — AB (ref >60)
GLUCOSE: 138 mg/dL — AB (ref 65–99)
Potassium: 4.3 mmol/L (ref 3.5–5.1)
SODIUM: 143 mmol/L (ref 135–145)
Total Bilirubin: 0.6 mg/dL (ref 0.3–1.2)
Total Protein: 5.9 g/dL — ABNORMAL LOW (ref 6.5–8.1)

## 2017-03-09 LAB — URINALYSIS, ROUTINE W REFLEX MICROSCOPIC
BILIRUBIN URINE: NEGATIVE
Glucose, UA: NEGATIVE mg/dL
Hgb urine dipstick: NEGATIVE
KETONES UR: NEGATIVE mg/dL
Leukocytes, UA: NEGATIVE
NITRITE: NEGATIVE
PH: 6.5 (ref 5.0–8.0)
Protein, ur: 100 mg/dL — AB
Specific Gravity, Urine: 1.02 (ref 1.005–1.030)

## 2017-03-09 LAB — URINALYSIS, MICROSCOPIC (REFLEX): RBC / HPF: NONE SEEN RBC/hpf (ref 0–5)

## 2017-03-09 LAB — TROPONIN I
Troponin I: 0.06 ng/mL (ref <0.03)
Troponin I: 0.06 ng/mL (ref <0.03)

## 2017-03-09 LAB — BRAIN NATRIURETIC PEPTIDE: B NATRIURETIC PEPTIDE 5: 1076.6 pg/mL — AB (ref 0.0–100.0)

## 2017-03-09 MED ORDER — LEVOFLOXACIN 750 MG PO TABS
750.0000 mg | ORAL_TABLET | Freq: Once | ORAL | Status: AC
  Administered 2017-03-09: 750 mg via ORAL
  Filled 2017-03-09: qty 1

## 2017-03-09 MED ORDER — LEVOFLOXACIN 750 MG PO TABS: 750.0000 mg | ORAL_TABLET | Freq: Every day | ORAL | 0 refills | Status: AC

## 2017-03-09 NOTE — ED Triage Notes (Signed)
Daughter is a Orthoptist MD and has been treating the patient for the last month. The daughter reports that the patient had vomiting about 1 month ago and she is concerned that he aspirated. About a week a go he started to have bloody sputum and she treated with antibiotic that she had at home. The daughter reports that he also went into some failure and she gave him 2 doses of lasix and now she thinks that he is dry and he still has a course cough. Patient has history of worsening expressive aphagia

## 2017-03-09 NOTE — ED Notes (Signed)
ED Provider at bedside discussing test results and dispo plan of care with pts daughter.

## 2017-03-09 NOTE — ED Notes (Signed)
Date and time results received: 03/09/17 1703 (use smartphrase ".now" to insert current time)  Test: Troponin  Critical Value: 0.06  Name of Provider Notified: Dr Laverta Baltimore  Orders Received? Or Actions Taken?:

## 2017-03-09 NOTE — ED Notes (Signed)
Patient transported to X-ray 

## 2017-03-09 NOTE — Discharge Instructions (Signed)
We believe that your symptoms are caused today by pneumonia, an infection in your lung(s).  Fortunately you should start to improve quickly after taking your antibiotics.  Please take the full course of antibiotics as prescribed and drink plenty of fluids.   ° °Follow up with your doctor within 1-2 days.  If you develop any new or worsening symptoms, including but not limited to fever in spite of taking over-the-counter ibuprofen and/or Tylenol, persistent vomiting, worsening shortness of breath, or other symptoms that concern you, please return to the Emergency Department immediately.  ° ° °Pneumonia °Pneumonia is an infection of the lungs.  °CAUSES °Pneumonia may be caused by bacteria or a virus. Usually, these infections are caused by breathing infectious particles into the lungs (respiratory tract). °SIGNS AND SYMPTOMS  °Cough. °Fever. °Chest pain. °Increased rate of breathing. °Wheezing. °Mucus production. °DIAGNOSIS  °If you have the common symptoms of pneumonia, your health care provider will typically confirm the diagnosis with a chest X-ray. The X-ray will show an abnormality in the lung (pulmonary infiltrate) if you have pneumonia. Other tests of your blood, urine, or sputum may be done to find the specific cause of your pneumonia. Your health care provider may also do tests (blood gases or pulse oximetry) to see how well your lungs are working. °TREATMENT  °Some forms of pneumonia may be spread to other people when you cough or sneeze. You may be asked to wear a mask before and during your exam. Pneumonia that is caused by bacteria is treated with antibiotic medicine. Pneumonia that is caused by the influenza virus may be treated with an antiviral medicine. Most other viral infections must run their course. These infections will not respond to antibiotics.  °HOME CARE INSTRUCTIONS  °Cough suppressants may be used if you are losing too much rest. However, coughing protects you by clearing your lungs. You  should avoid using cough suppressants if you can. °Your health care provider may have prescribed medicine if he or she thinks your pneumonia is caused by bacteria or influenza. Finish your medicine even if you start to feel better. °Your health care provider may also prescribe an expectorant. This loosens the mucus to be coughed up. °Take medicines only as directed by your health care provider. °Do not smoke. Smoking is a common cause of bronchitis and can contribute to pneumonia. If you are a smoker and continue to smoke, your cough may last several weeks after your pneumonia has cleared. °A cold steam vaporizer or humidifier in your room or home may help loosen mucus. °Coughing is often worse at night. Sleeping in a semi-upright position in a recliner or using a couple pillows under your head will help with this. °Get rest as you feel it is needed. Your body will usually let you know when you need to rest. °PREVENTION °A pneumococcal shot (vaccine) is available to prevent a common bacterial cause of pneumonia. This is usually suggested for: °People over 65 years old. °Patients on chemotherapy. °People with chronic lung problems, such as bronchitis or emphysema. °People with immune system problems. °If you are over 65 or have a high risk condition, you may receive the pneumococcal vaccine if you have not received it before. In some countries, a routine influenza vaccine is also recommended. This vaccine can help prevent some cases of pneumonia. You may be offered the influenza vaccine as part of your care. °If you smoke, it is time to quit. You may receive instructions on how to stop smoking. Your   health care provider can provide medicines and counseling to help you quit. °SEEK MEDICAL CARE IF: °You have a fever. °SEEK IMMEDIATE MEDICAL CARE IF:  °Your illness becomes worse. This is especially true if you are elderly or weakened from any other disease. °You cannot control your cough with suppressants and are losing  sleep. °You begin coughing up blood. °You develop pain which is getting worse or is uncontrolled with medicines. °Any of the symptoms which initially brought you in for treatment are getting worse rather than better. °You develop shortness of breath or chest pain. °MAKE SURE YOU:  °Understand these instructions. °Will watch your condition. °Will get help right away if you are not doing well or get worse. °Document Released: 07/09/2005 Document Revised: 11/23/2013 Document Reviewed: 09/28/2010 °Exit[employer]® Patient Information ©2015 [employer]. This information is not intended to replace advice given to you by your health care provider. Make sure you discuss any questions you have with your health care provider. ° ° ° °

## 2017-03-09 NOTE — ED Provider Notes (Signed)
Emergency Department Provider Note  By signing my name below, I, Ephriam Jenkins, attest that this documentation has been prepared under the direction and in the presence of No att. providers found. Electronically signed, Ephriam Jenkins, ED Scribe. 03/09/17. 4:24 PM.  I have reviewed the triage vital signs and the nursing notes.   HISTORY  Chief Complaint Cough   HPI: LEVEL 5 CAVEAT DUE TO SOMNOLENCE AND APHASIA Gerald Fox is a 83 y.o. male who presents to the Emergency Department, brought in by his daughter. Pt brought to ED by his daughter who is a family practice MD and provided the history. Per daughter: Pt is otherwise healthy. One year ago pt fell and broke his hip. He had surgery and has been ambulating per normal since. This year on 10/06/16 pt was admitted to the hospital for Pneumonia. He was treated with IV abx and discharged home without any immediate complications. Pt was fine until approximately one month ago when he started to clear his throat more often and has noticed white sputum and cough. Daughter notes that she noticed these symptoms prior to pt being admitted. In May pt's PCP gave him a course of abx which he finished as directed. Approximately one month ago, pt woke up in the middle of the night with projectile vomiting and daughter has been concerned for aspiration from this. Daughter has also noticed that the pt has had hemoptysis recently which she describes as light brown with streaks of blood. After the episodes of vomiting daughter reports that the pt seemed to improve following this. He was never seen or evaluated for these symptoms. Two weeks ago, pt's daughter noticed the throat clearing and hemoptysis again. Daughter had Cephalexin at home which she gave the pt 7 days ago and pt seemed to improve slightly. Pt has Hx of CHF and has pacemaker placed in March of this year. Pt's daughter believes that his symptoms within the past two weeks may have exacerbated his CHF.  Pt was given Lasix following his previous cardiology appointment in March, but daughter states that regularly taking the Lasix made the pt dehydrated and has only being giving it to the pt prn. She started giving the pt Lasix two days following the abx she gave him. She states that the pt has had darker urine than normal but is unsure if this is due to increased voiding from the Lasix. Daughter further reports that the pt has been sleeping more recently and this morning pt seemed more sleepy than normal. Pt fell asleep while she was shaving him which he does not normally do. The pt was so deep in sleep that she had to sternal rub him to wake the pt. Daughter notes concern for a new Pneumonia and brought pt in concerns for recent symptoms. She denies noticing pt with significant leg swelling as he wears compression stockings to control this.   Past Medical History:  Diagnosis Date  . A-fib ([place])   . Arthritis   . BPH (benign prostatic hyperplasia)   . Cancer [facility]) 1996   brain tumor  . Falls frequently   . Glaucoma   . Hypertension   . Macular degeneration   . Pacemaker   . Peptic ulcer disease with hemorrhage   . Renal disorder   . Renal insufficiency   . Third degree AV block ([place])   . Vitamin D deficiency     There are no active problems to display for this patient.   Past Surgical History:  Procedure Laterality Date  . APPENDECTOMY    . BRAIN MENINGIOMA EXCISION    . EXTERNAL EAR SURGERY    . EYE SURGERY    . PACEMAKER INSERTION    . TONSILLECTOMY      Current Outpatient Rx  . Order #: [id number] Class: Historical Med  . Order #: [id number] Class: Historical Med  . Order #: [id number] Class: Historical Med  . Order #: [id number] Class: Historical Med  . Order #: [id number] Class: Historical Med  . Order #: [id number] Class: Print  . Order #: [id number] Class: Historical Med    Allergies Patient has no known allergies.  History reviewed. No pertinent family history.  Social  History Social History  Substance Use Topics  . Smoking status: Never Smoker  . Smokeless tobacco: Never Used  . Alcohol use Yes    Review of Systems: LEVEL 5 CAVEAT SECONDARY TO SOMNOLENCE AND APHASIA   ____________________________________________   PHYSICAL EXAM:  VITAL SIGNS: ED Triage Vitals [03/09/17 1538]  Enc Vitals Group     BP (!) 142/78     Pulse Rate 76     Resp 20     Temp 97.6 F (36.4 C)     Temp Source Oral     SpO2 95 %   Constitutional: Alert but aphasic. No acute distress.  Eyes: Conjunctivae are normal. PERRL. Head: Atraumatic. Nose: No congestion/rhinnorhea. Mouth/Throat: Mucous membranes are slightly dry.  Neck: No stridor.   Cardiovascular: Normal rate, regular rhythm. Good peripheral circulation. Grossly normal heart sounds.   Respiratory: Normal respiratory effort.  No retractions. Lungs CTAB. Gastrointestinal: Soft and nontender. No distention.  Musculoskeletal: No lower extremity tenderness nor edema. No gross deformities of extremities. Neurologic:  Baseline aphasia. No gross focal neurologic deficits are appreciated.  Skin:  Skin is warm, dry and intact. No rash noted.   ____________________________________________   LABS (all labs ordered are listed, but only abnormal results are displayed)  Labs Reviewed  COMPREHENSIVE METABOLIC PANEL - Abnormal; Notable for the following:       Result Value   Glucose, Bld 138 (*)    BUN 23 (*)    Creatinine, Ser 1.50 (*)    Calcium 8.3 (*)    Total Protein 5.9 (*)    Albumin 3.2 (*)    ALT 13 (*)    GFR calc non Af Amer 36 (*)    GFR calc Af Amer 42 (*)    All other components within normal limits  CBC WITH DIFFERENTIAL/PLATELET - Abnormal; Notable for the following:    RBC 3.40 (*)    Hemoglobin 11.0 (*)    HCT 35.4 (*)    MCV 104.1 (*)    All other components within normal limits  TROPONIN I - Abnormal; Notable for the following:    Troponin I 0.06 (*)    All other components within  normal limits  BRAIN NATRIURETIC PEPTIDE - Abnormal; Notable for the following:    B Natriuretic Peptide 1,076.6 (*)    All other components within normal limits  URINALYSIS, ROUTINE W REFLEX MICROSCOPIC - Abnormal; Notable for the following:    Protein, ur 100 (*)    All other components within normal limits  URINALYSIS, MICROSCOPIC (REFLEX) - Abnormal; Notable for the following:    Bacteria, UA RARE (*)    Squamous Epithelial / LPF 0-5 (*)    All other components within normal limits  TROPONIN I - Abnormal; Notable for the following:    Troponin I 0.06 (*)    All other components  within normal limits  URINE CULTURE   ____________________________________________  EKG   EKG Interpretation  Date/Time:  Saturday March 09 2017 16:26:31 EDT Ventricular Rate:  76 PR Interval:    QRS Duration: 151 QT Interval:  463 QTC Calculation: 521 R Axis:   -56 Text Interpretation:  A and ventricular-paced rhythm No further analysis attempted due to paced rhythm Confirmed by Nanda Quinton [id number]) on 03/09/2017 4:30:20 PM       ____________________________________________  RADIOLOGY  Dg Chest 2 View  Result Date: 03/09/2017 CLINICAL DATA:  Cough.  Recent congestive heart failure EXAM: CHEST  2 VIEW COMPARISON:  October 09, 2016 FINDINGS: There is interstitial pulmonary edema with small pleural effusions. There is probable alveolar edema in the bases. 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 Pacemaker leads are attached to the right atrium and right ventricle. There is aortic atherosclerosis. Bones are somewhat osteoporotic. There is degenerative change in the thoracic spine. IMPRESSION: Congestive heart failure. Probable alveolar edema in the bases. A degree of pneumonia and/ or aspiration in the bases cannot be entirely excluded radiographically. There is stable cardiomegaly. There is aortic atherosclerosis. Pacemaker leads attached to right atrium and right ventricle. Aortic  Atherosclerosis (ICD10-I70.0). Electronically Signed   By: Lowella Grip III M.D.   On: 03/09/2017 17:13    ____________________________________________   PROCEDURES  Procedure(s) performed:   Procedures  None ____________________________________________   INITIAL IMPRESSION / ASSESSMENT AND PLAN / ED COURSE  Pertinent labs & imaging results that were available during my care of the patient were reviewed by me and considered in my medical decision making (see chart for details).  Patient presents to the ED with daughter who is primary caretaker and physician with increased throat clearing and concern for developing PNA. No fever. Patient on baseline O2 by [place]. Exam largely unremarkable. She is clear that her goal for him is comfort and would like to avoid hospital admission if at all possible. No evidence of sepsis on initial exam/vitals. Has known CHF history but appears volume down.   06:39 PM Patient lab work is near his baseline. BNP actually much lower than prior when compared to labs on Care Everywhere.   7:50 PM Repeat troponin unchanged. Plan to treat for possible PNA after review of CXR and patient's symptoms. Will cover with Levaquin with concern for possible aspiration. Offered admission but daughter would like to return home which seems reasonable given our goals of care discussion. Will speak to PCP regarding diuresis plan.   At this time, I do not feel there is any life-threatening condition present. I have reviewed and discussed all results (EKG, imaging, lab, urine as appropriate), exam findings with patient. I have reviewed nursing notes and appropriate previous records.  I feel the patient is safe to be discharged home without further emergent workup. Discussed usual and customary return precautions. Patient and family (if present) verbalize understanding and are comfortable with this plan.  Patient will follow-up with their primary care provider. If they do not have  a primary care provider, information for follow-up has been provided to them. All questions have been answered.  ____________________________________________  FINAL CLINICAL IMPRESSION(S) / ED DIAGNOSES  Final diagnoses:  Cough  Generalized weakness     MEDICATIONS GIVEN DURING THIS VISIT:  Medications  levofloxacin (LEVAQUIN) tablet 750 mg (750 mg Oral Given 03/09/17 2018)     NEW OUTPATIENT MEDICATIONS STARTED DURING THIS VISIT:  Discharge Medication List as of 03/09/2017  8:08 PM    START taking  these medications   Details  levofloxacin (LEVAQUIN) 750 MG tablet Take 1 tablet (750 mg total) by mouth daily., Starting Sun 03/10/2017, Until Thu 03/14/2017, Print       I personally performed the services described in this documentation, which was scribed in my presence. The recorded information has been reviewed and is accurate.    Note:  This document was prepared using Dragon voice recognition software and may include unintentional dictation errors.  Nanda Quinton, MD Emergency Medicine    Markell Schrier, [facility], MD 03/10/17 1100

## 2017-03-11 LAB — URINE CULTURE
Culture: <10000 — AB
SPECIAL REQUESTS: NORMAL

## 2017-04-03 ENCOUNTER — Encounter (HOSPITAL_BASED_OUTPATIENT_CLINIC_OR_DEPARTMENT_OTHER): Payer: Self-pay | Admitting: *Deleted

## 2017-04-03 ENCOUNTER — Emergency Department (HOSPITAL_BASED_OUTPATIENT_CLINIC_OR_DEPARTMENT_OTHER)
Admission: EM | Admit: 2017-04-03 | Discharge: 2017-04-03 | Disposition: A | Discharge status: Home / Self Care | Payer: Medicare HMO | Attending: Emergency Medicine | Admitting: Emergency Medicine

## 2017-04-03 DIAGNOSIS — Z85841 Personal history of malignant neoplasm of brain: Secondary | ICD-10-CM | POA: Diagnosis not present

## 2017-04-03 DIAGNOSIS — Z79899 Other long term (current) drug therapy: Secondary | ICD-10-CM | POA: Diagnosis not present

## 2017-04-03 DIAGNOSIS — Z7982 Long term (current) use of aspirin: Secondary | ICD-10-CM | POA: Insufficient documentation

## 2017-04-03 DIAGNOSIS — I10 Essential (primary) hypertension: Secondary | ICD-10-CM | POA: Insufficient documentation

## 2017-04-03 DIAGNOSIS — R197 Diarrhea, unspecified: Secondary | ICD-10-CM | POA: Diagnosis not present

## 2017-04-03 DIAGNOSIS — Z950 Presence of cardiac pacemaker: Secondary | ICD-10-CM | POA: Insufficient documentation

## 2017-04-03 DIAGNOSIS — I4891 Unspecified atrial fibrillation: Secondary | ICD-10-CM | POA: Diagnosis not present

## 2017-04-03 LAB — CBC WITH DIFFERENTIAL/PLATELET
Basophils Absolute: 0 10*3/uL (ref 0.0–0.1)
Basophils Relative: 0 %
Eosinophils Absolute: 0.1 10*3/uL (ref 0.0–0.7)
Eosinophils Relative: 1 %
HEMATOCRIT: 41.7 % (ref 39.0–52.0)
HEMOGLOBIN: 13.4 g/dL (ref 13.0–17.0)
LYMPHS ABS: 1.9 10*3/uL (ref 0.7–4.0)
LYMPHS PCT: 22 %
MCH: 32.8 pg (ref 26.0–34.0)
MCHC: 32.1 g/dL (ref 30.0–36.0)
MCV: 102 fL — AB (ref 78.0–100.0)
Monocytes Absolute: 1.4 10*3/uL — ABNORMAL HIGH (ref 0.1–1.0)
Monocytes Relative: 17 %
NEUTROS PCT: 60 %
Neutro Abs: 5.1 10*3/uL (ref 1.7–7.7)
Platelets: 171 10*3/uL (ref 150–400)
RBC: 4.09 MIL/uL — AB (ref 4.22–5.81)
RDW: 14.1 % (ref 11.5–15.5)
WBC: 8.5 10*3/uL (ref 4.0–10.5)

## 2017-04-03 LAB — COMPREHENSIVE METABOLIC PANEL
ALK PHOS: 58 U/L (ref 38–126)
ALT: 9 U/L — AB (ref 17–63)
AST: 23 U/L (ref 15–41)
Albumin: 3 g/dL — ABNORMAL LOW (ref 3.5–5.0)
Anion gap: 7 (ref 5–15)
BILIRUBIN TOTAL: 0.9 mg/dL (ref 0.3–1.2)
BUN: 22 mg/dL — ABNORMAL HIGH (ref 6–20)
CALCIUM: 8.2 mg/dL — AB (ref 8.9–10.3)
CO2: 26 mmol/L (ref 22–32)
CREATININE: 1.35 mg/dL — AB (ref 0.61–1.24)
Chloride: 109 mmol/L (ref 101–111)
GFR calc non Af Amer: 41 mL/min — ABNORMAL LOW (ref >60)
GFR, EST AFRICAN AMERICAN: 48 mL/min — AB (ref >60)
Glucose, Bld: 148 mg/dL — ABNORMAL HIGH (ref 65–99)
Potassium: 3.9 mmol/L (ref 3.5–5.1)
Sodium: 142 mmol/L (ref 135–145)
Total Protein: 5.9 g/dL — ABNORMAL LOW (ref 6.5–8.1)

## 2017-04-03 NOTE — ED Notes (Signed)
Pt taken to room 4, assisted from wheelchair to stretcher x 3 staff assist. Undressed and assisted into gown for md exam.

## 2017-04-03 NOTE — ED Notes (Signed)
Appropriate collection cups and "nurse cap" for collection given to daughter, was told if stool was collected to take to his PMD

## 2017-04-03 NOTE — ED Notes (Signed)
ED Provider at bedside. 

## 2017-04-03 NOTE — ED Provider Notes (Signed)
[facility] DEPT [facility] Provider Note   CSN: [id number] Arrival date & time: 04/03/17  1519     History   Chief Complaint Chief Complaint  Patient presents with  . Diarrhea    HPI Gerald Fox is a 83 y.o. male.  HPI  83 year old male presents with his daughter. He is here for diarrhea for the last 2 weeks. At first it was loose stool with some formed stool. However over the last 1 week it seems to be more watery. There is no blood or mucus. He has not had abdominal pain during this time. There is no vomiting. Yesterday he had a temperature of 99.5 but no fevers over 100. However this morning when she was giving him a bath and pressing on his abdomen he seemed to wince in pain. He has not complained of abdominal pain since. He has been on multiple antibiotics in this past year for multiple instances of pneumonia, most recently about one month ago. No urinary symptoms. No current cough. She feels like his abdomen might be more swollen, but notes he is much weaker nowadays and is essentially bedbound.  Past Medical History:  Diagnosis Date  . A-fib ([place])   . Arthritis   . BPH (benign prostatic hyperplasia)   . Cancer [facility]) 1996   brain tumor  . Falls frequently   . Glaucoma   . Hypertension   . Macular degeneration   . Pacemaker   . Peptic ulcer disease with hemorrhage   . Renal disorder   . Renal insufficiency   . Third degree AV block ([place])   . Vitamin D deficiency     There are no active problems to display for this patient.   Past Surgical History:  Procedure Laterality Date  . APPENDECTOMY    . BRAIN MENINGIOMA EXCISION    . EXTERNAL EAR SURGERY    . EYE SURGERY    . PACEMAKER INSERTION    . TONSILLECTOMY         Home Medications    Prior to Admission medications   Medication Sig Start Date End Date Taking? Authorizing Provider  amLODipine (NORVASC) 5 MG tablet Take 5 mg by mouth daily.    [provider]  aspirin EC 81 MG tablet Take 81  mg by mouth daily.    [provider]  dorzolamide (TRUSOPT) 2 % ophthalmic solution 1 drop 3 (three) times daily.    [provider]  hydrochlorothiazide (MICROZIDE) 12.5 MG capsule Take 12.5 mg by mouth daily.    [provider]  latanoprost (XALATAN) 0.005 % ophthalmic solution 1 drop at bedtime.    [provider]  metoprolol succinate (TOPROL-XL) 100 MG 24 hr tablet Take 100 mg by mouth daily. Take with or immediately following a meal.    [provider]    Family History History reviewed. No pertinent family history.  Social History Social History  Substance Use Topics  . Smoking status: Never Smoker  . Smokeless tobacco: Never Used  . Alcohol use Yes     Allergies   Patient has no known allergies.   Review of Systems Review of Systems  Constitutional: Negative for fever.  Respiratory: Negative for cough.   Gastrointestinal: Positive for abdominal pain and diarrhea. Negative for vomiting.  Genitourinary: Negative for dysuria.  All other systems reviewed and are negative.    Physical Exam Updated Vital Signs BP (!) 174/85 (BP Location: Right Arm)   Pulse 84   Temp 98 F (36.7 C) (  Oral)   Resp 18   SpO2 96%   Physical Exam  Constitutional: He is oriented to person, place, and time. He appears well-developed and well-nourished.  HENT:  Head: Normocephalic and atraumatic.  Right Ear: External ear normal.  Left Ear: External ear normal.  Nose: Nose normal.  Eyes: Right eye exhibits no discharge. Left eye exhibits no discharge.  Neck: Neck supple.  Cardiovascular: Normal rate, regular rhythm and normal heart sounds.   Pulmonary/Chest: Effort normal and breath sounds normal. He has no rales.  Abdominal: Soft. He exhibits no distension and no mass. There is no tenderness. There is no guarding.  Musculoskeletal: He exhibits edema (BLE pitting edema).  Neurological: He is alert and oriented to person, place, and time.    Skin: Skin is warm and dry. He is not diaphoretic.  Nursing note and vitals reviewed.    ED Treatments / Results  Labs (all labs ordered are listed, but only abnormal results are displayed) Labs Reviewed  CBC WITH DIFFERENTIAL/PLATELET - Abnormal; Notable for the following:       Result Value   RBC 4.09 (*)    MCV 102.0 (*)    Monocytes Absolute 1.4 (*)    All other components within normal limits  COMPREHENSIVE METABOLIC PANEL - Abnormal; Notable for the following:    Glucose, Bld 148 (*)    BUN 22 (*)    Creatinine, Ser 1.35 (*)    Calcium 8.2 (*)    Total Protein 5.9 (*)    Albumin 3.0 (*)    ALT 9 (*)    GFR calc non Af Amer 41 (*)    GFR calc Af Amer 48 (*)    All other components within normal limits  C DIFFICILE QUICK SCREEN W PCR REFLEX  GASTROINTESTINAL PANEL BY PCR, STOOL (REPLACES STOOL CULTURE)    EKG  EKG Interpretation None       Radiology No results found.  Procedures Procedures (including critical care time)  Medications Ordered in ED Medications - No data to display   Initial Impression / Assessment and Plan / ED Course  I have reviewed the triage vital signs and the nursing notes.  Pertinent labs & imaging results that were available during my care of the patient were reviewed by me and considered in my medical decision making (see chart for details).     Patient appears stable. Vital signs are unremarkable besides hypertension. I really does not appear in acute respiratory distress and there is no abdominal tenderness on my exam. He may have gained weight but I do not see significant abdominal distention. There is no vomiting. No concerning findings on history either such as current fevers, bloody diarrhea. He was unable to produce a sample here. His electrolytes and white blood cell count are benign. Follow-up with PCP for outpatient GI testing and workup. Discussed return precautions.  Final Clinical Impressions(s) / ED Diagnoses    Final diagnoses:  Diarrhea, unspecified type    New Prescriptions New Prescriptions   No medications on file     Sherwood Gambler, MD 04/03/17 1652

## 2017-04-03 NOTE — ED Triage Notes (Signed)
Pt daughter reports pt with diarrhea, 2 episodes a day x 3 weeks. Has not seen his pcp per daughter.

## 2017-09-20 DEATH — deceased

## 2018-02-11 IMAGING — CT CT HEAD W/O CM
3 of 4 series · 14 of 47 positions shown, 16 images · non-contrast
Comparison: CT HEAD March 22, 2015

CLINICAL DATA: Altered mental status, mild aphasia. Pre syncopal
episode today. Fell 1 week ago without head injury. History of
atrial fibrillation and pacemaker, cancer.

EXAM:
CT HEAD WITHOUT CONTRAST
TECHNIQUE: Contiguous axial images were obtained from the base of the skull
through the vertex without intravenous contrast.

[Series 3: head wo · axial · 0.47mm/px · z∈[+801,+931]mm · 8 of 31 slices shown, 10 images]
[im 3/31  brain]
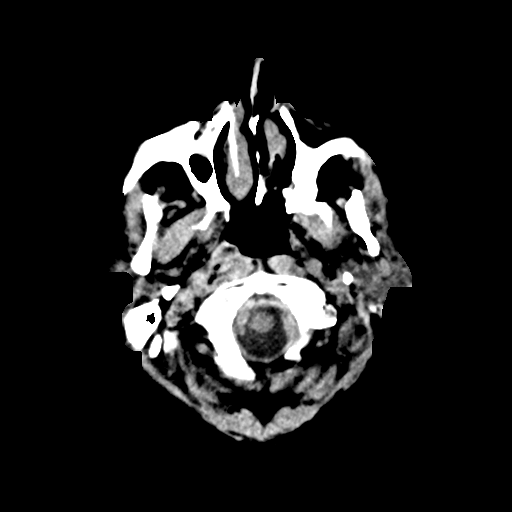
[im 3/31  bone]
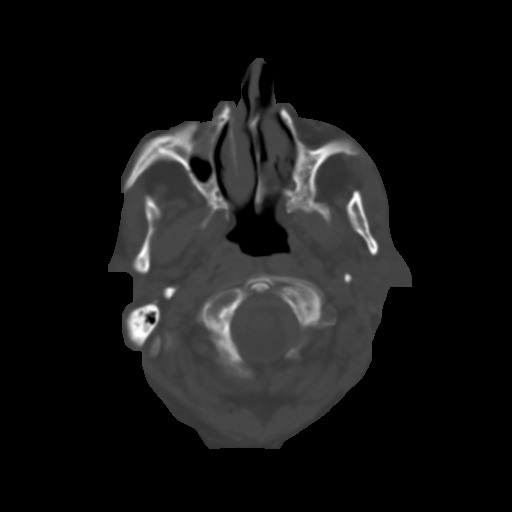
[im 7/31  brain]
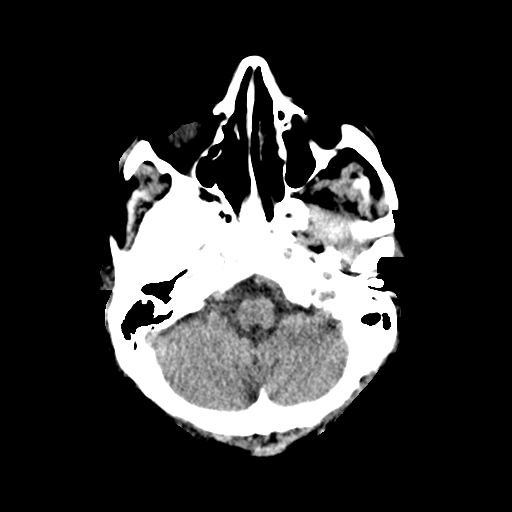
[im 11/31  brain]
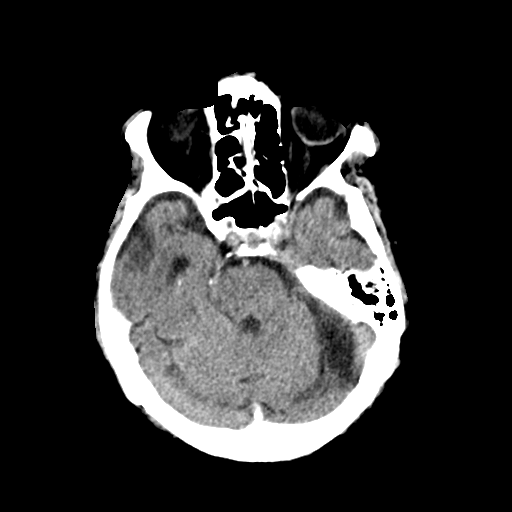
[im 15/31  brain]
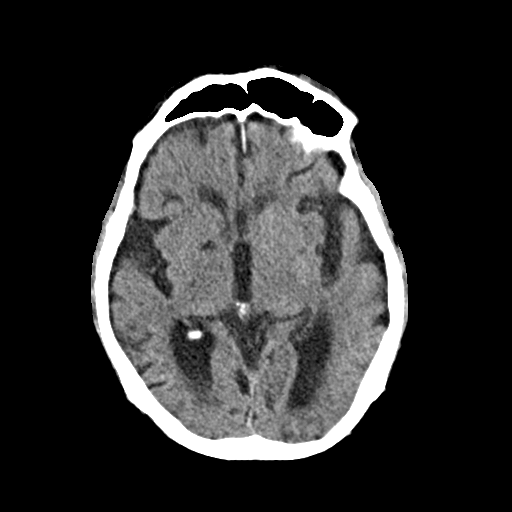
[im 17/31  brain]
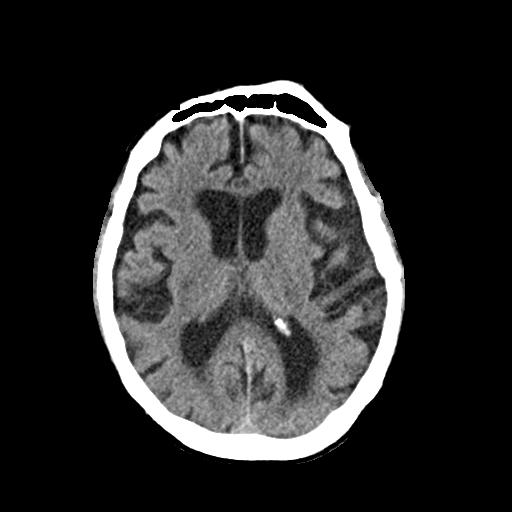
[im 17/31  bone]
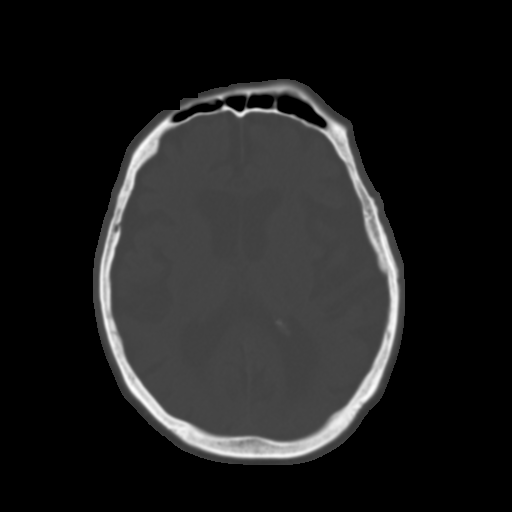
[im 21/31  brain]
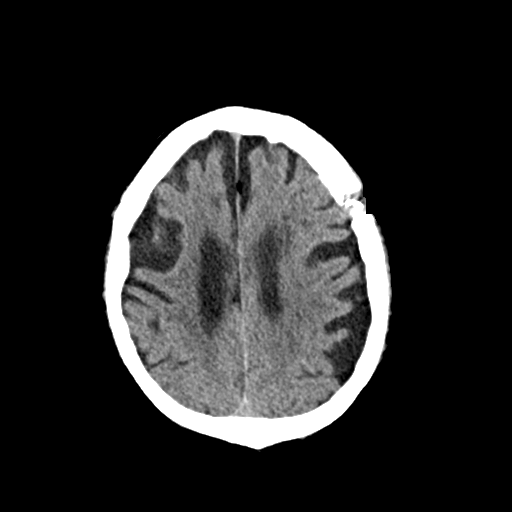
[im 25/31  brain]
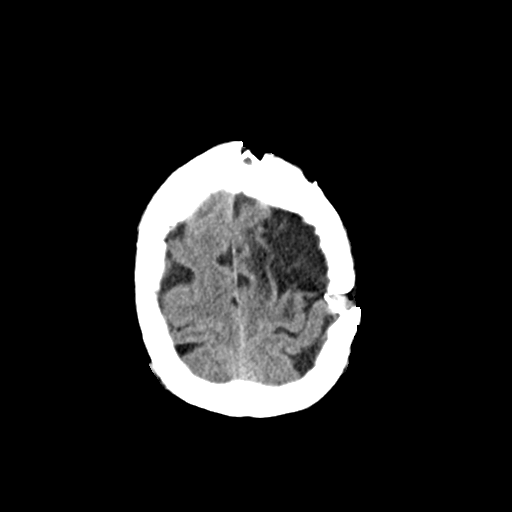
[im 29/31  brain]
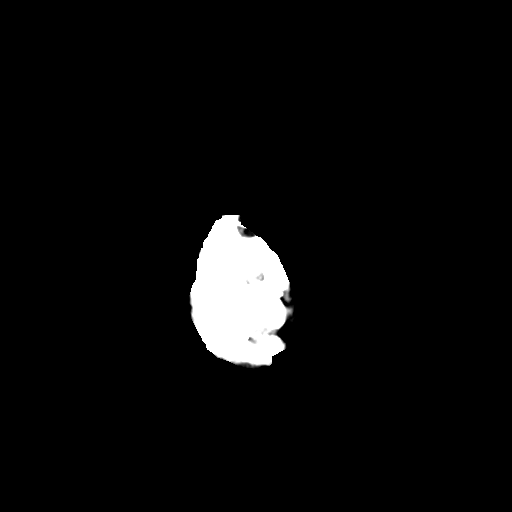

[Series 5: coronal soft · coronal · 0.33mm/px · 3 of 69 slices shown]
[im 23/69  brain]
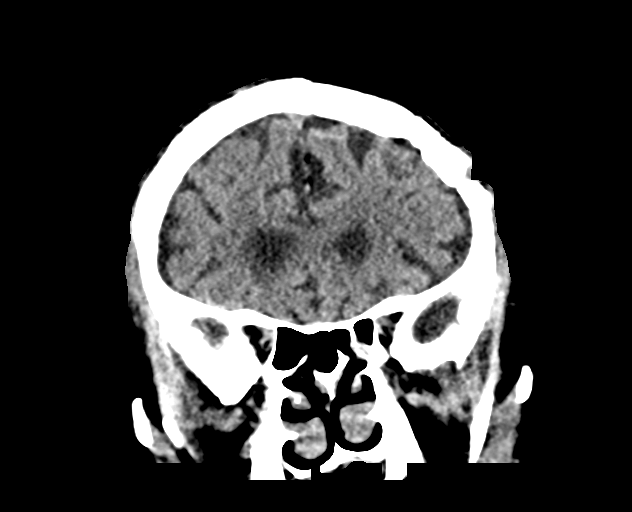
[im 31/69  brain]
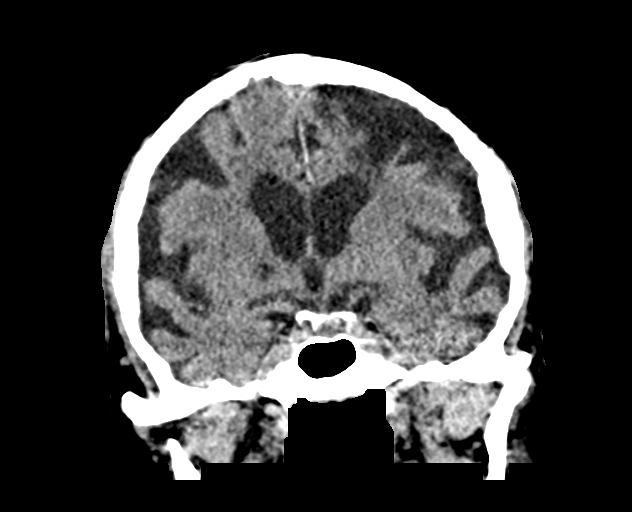
[im 38/69  brain]
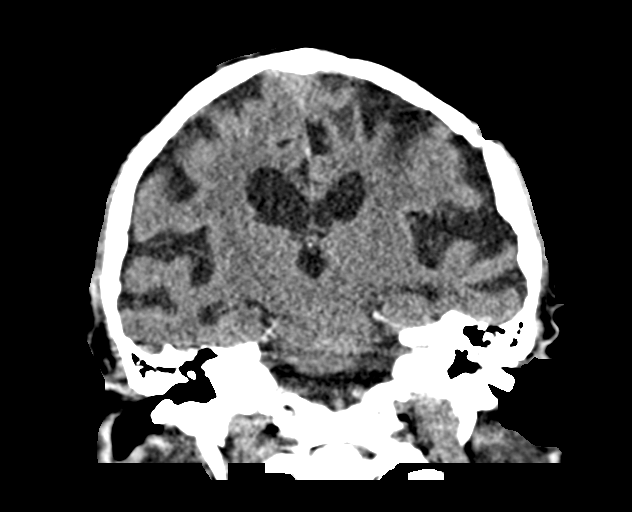

[Series 6: sag soft · sagittal · 0.31mm/px · 3 of 57 slices shown]
[im 19/57  brain]
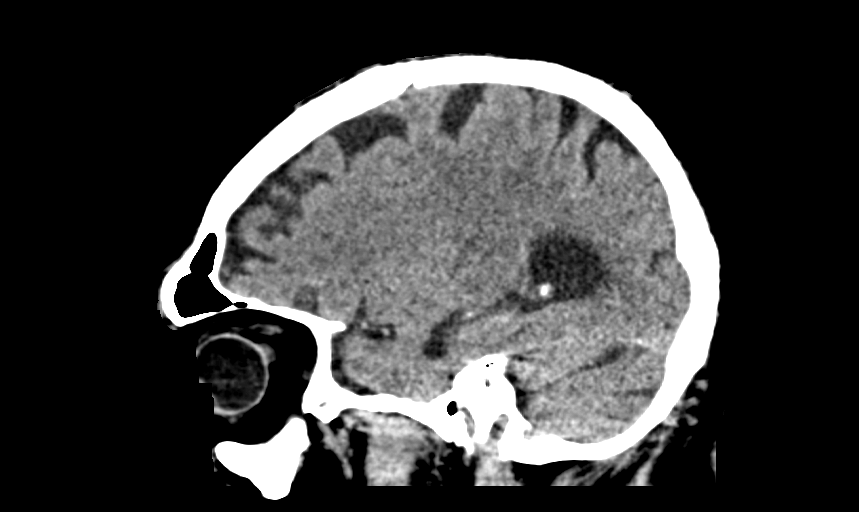
[im 29/57  brain]
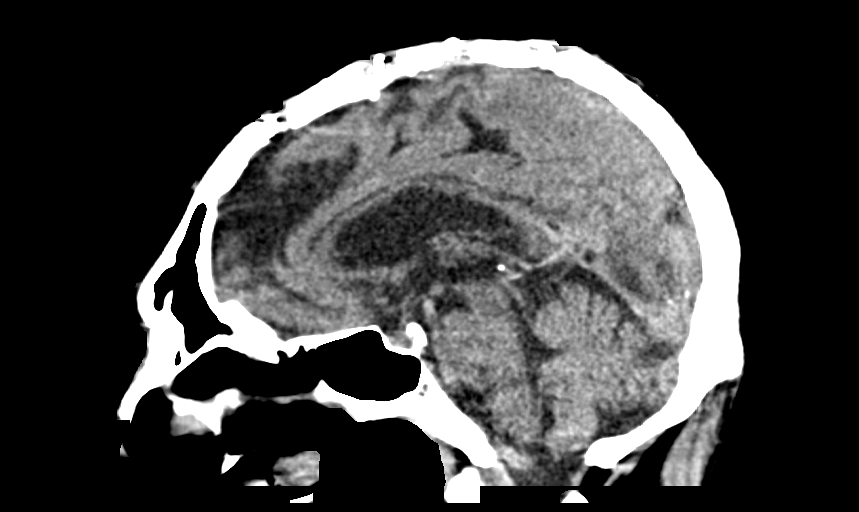
[im 38/57  brain]
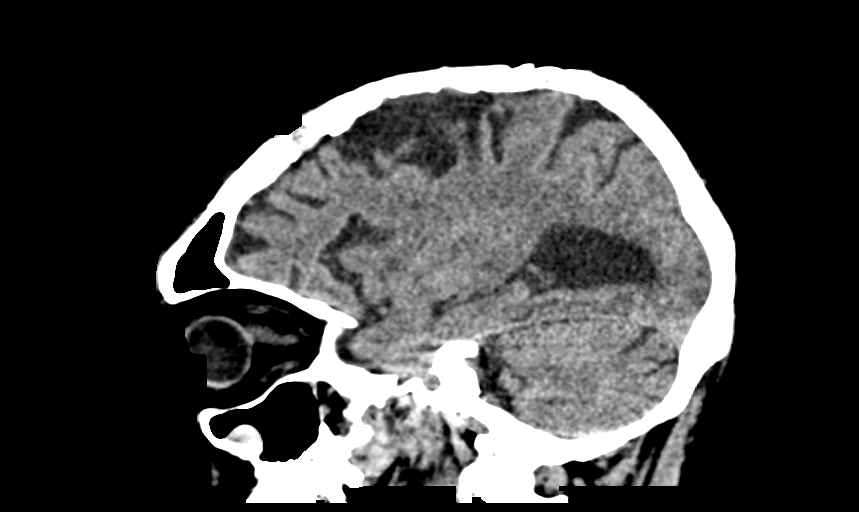

[14 of 47 positions shown; findings below may reference images not displayed]

FINDINGS: INTRACRANIAL CONTENTS: The ventricles and sulci are normal for age.
No intraparenchymal hemorrhage, mass effect nor midline shift.
Patchy supratentorial white matter hypodensities are less than
expected for patient's age and though non-specific likely represent
chronic small vessel ischemic disease. LEFT frontal lobe
encephalomalacia. New RIGHT inferior basal ganglia lacunar infarct,
does not appear acute. No acute large vascular territory infarcts.
No abnormal extra-axial fluid collections. Basal cisterns are
patent. Moderate calcific atherosclerosis of the carotid siphons.

ORBITS: The included ocular globes and orbital contents are
non-suspicious. Status post bilateral ocular lens implants.

SINUSES: Mucosal retention cysts.  Bilateral mastoid effusions.

SKULL/SOFT TISSUES: No skull fracture. No significant soft tissue
swelling. Old LEFT craniotomy with multiple bilateral burr holes.
IMPRESSION: No acute intracranial process.

New RIGHT inferior basal ganglia lacunar infarct does not appear
acute. Chronic changes including LEFT frontal encephalomalacia,
status post craniotomy.

## 2019-03-14 IMAGING — DX DG CHEST 2V
2 series · 2 of 2 positions shown · non-contrast
Comparison: October 09, 2016

CLINICAL DATA: Cough.  Recent congestive heart failure

EXAM:
CHEST  2 VIEW

[chest lat]
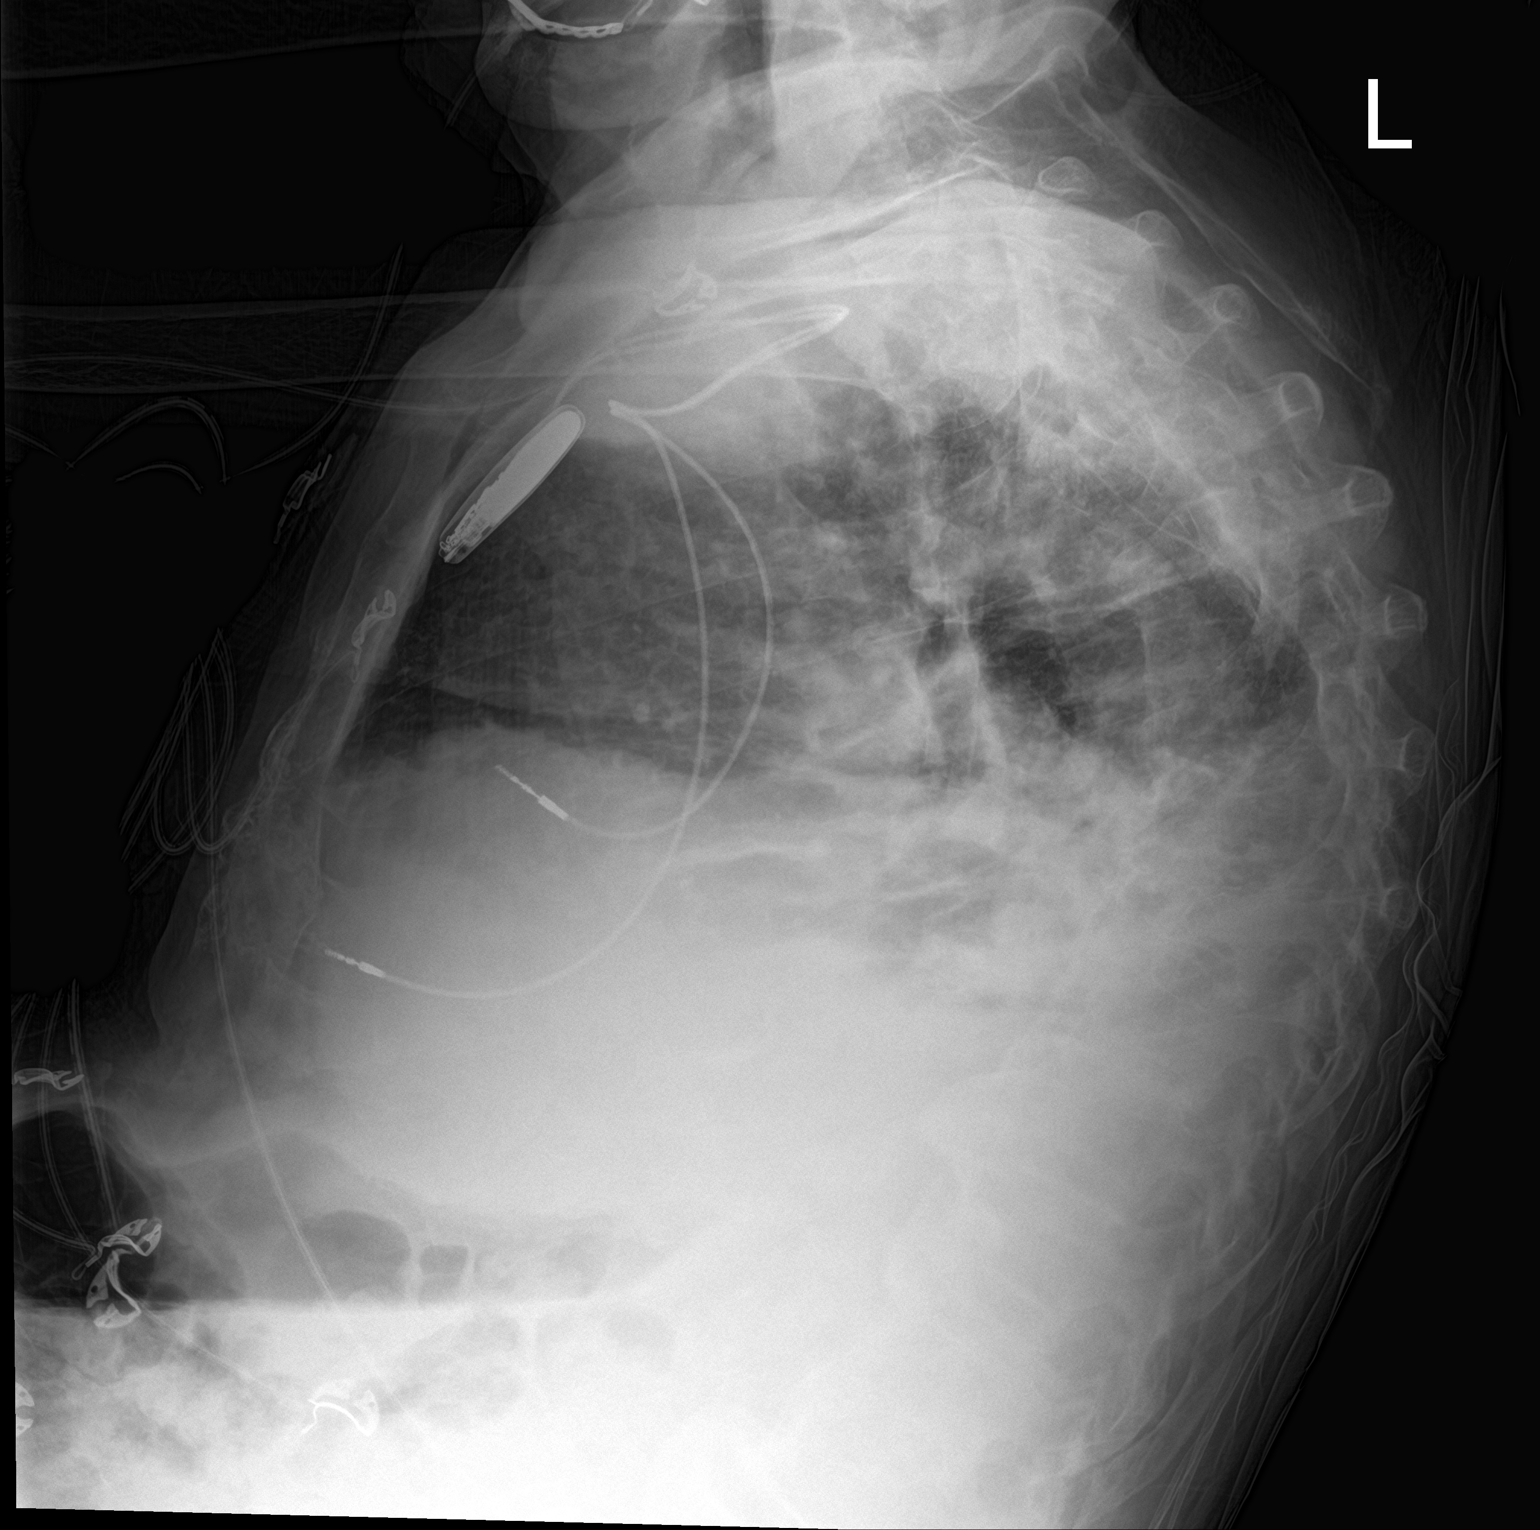

[chest ap strecther]
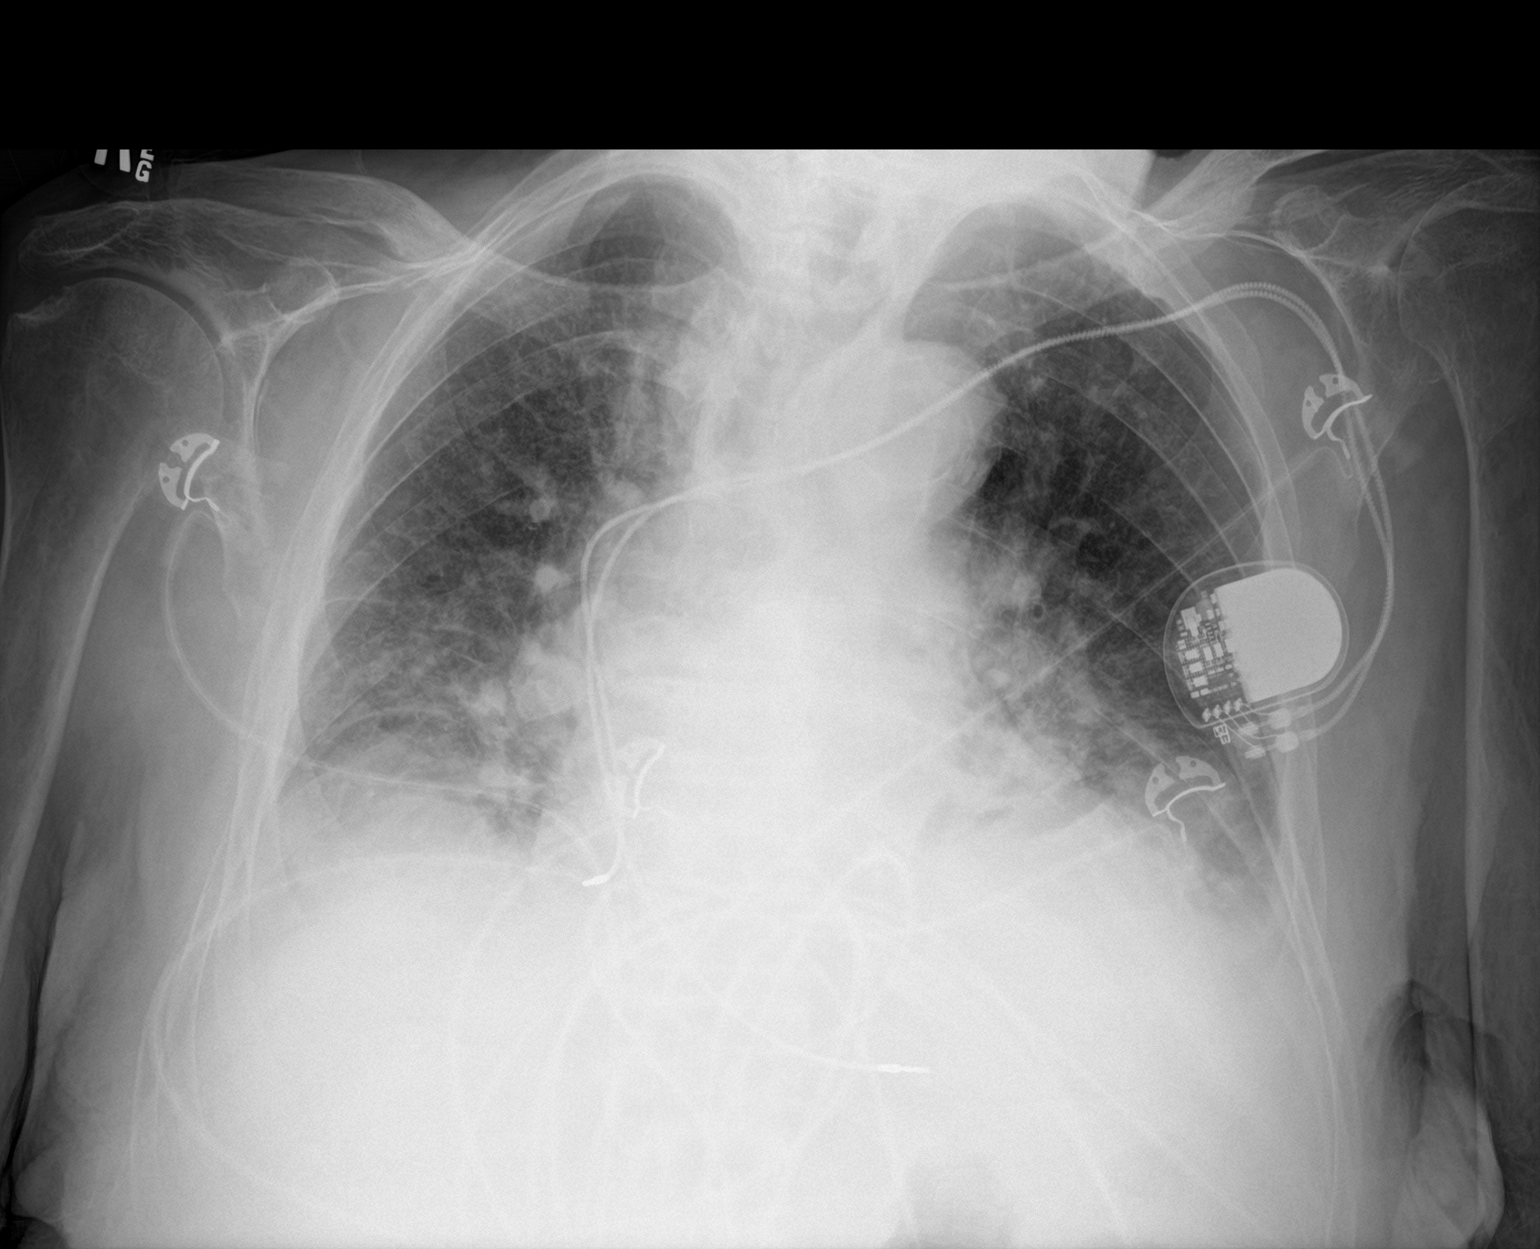

[2 of 2 positions shown; findings below may reference images not displayed]

FINDINGS: There is interstitial pulmonary edema with small pleural effusions.
There is probable alveolar edema in the bases.

There is cardiomegaly with pulmonary venous hypertension. Pacemaker
leads are attached to the right atrium and right ventricle. There is
aortic atherosclerosis. Bones are somewhat osteoporotic. There is
degenerative change in the thoracic spine.
IMPRESSION: Congestive heart failure. Probable alveolar edema in the bases. A
degree of pneumonia and/ or aspiration in the bases cannot be
entirely excluded radiographically. There is stable cardiomegaly.
There is aortic atherosclerosis. Pacemaker leads attached to right
atrium and right ventricle.

Aortic Atherosclerosis ([id number]).
